# Patient Record
Sex: Male | Born: 1985 | Race: Black or African American | Hispanic: No | Marital: Single | Smoking: Never smoker
Health system: Southern US, Community
[De-identification: ages and names within clinical notes are randomized; demographics above are authoritative.]

## PROBLEM LIST (undated history)

## (undated) DIAGNOSIS — G7000 Myasthenia gravis without (acute) exacerbation: Secondary | ICD-10-CM

## (undated) HISTORY — PX: NO PAST SURGERIES: SHX2092

## (undated) HISTORY — PX: OTHER SURGICAL HISTORY: SHX169

## (undated) HISTORY — DX: Myasthenia gravis without (acute) exacerbation: G70.00

---

## 2009-05-19 ENCOUNTER — Emergency Department (HOSPITAL_COMMUNITY): Admission: EM | Admit: 2009-05-19 | Discharge: 2009-05-20 | Payer: Self-pay | Admitting: Emergency Medicine

## 2009-05-23 ENCOUNTER — Emergency Department (HOSPITAL_COMMUNITY): Admission: EM | Admit: 2009-05-23 | Discharge: 2009-05-23 | Payer: Self-pay | Admitting: Emergency Medicine

## 2011-12-28 ENCOUNTER — Ambulatory Visit (INDEPENDENT_AMBULATORY_CARE_PROVIDER_SITE_OTHER): Payer: BC Managed Care – PPO | Admitting: Family Medicine

## 2011-12-28 DIAGNOSIS — Z20828 Contact with and (suspected) exposure to other viral communicable diseases: Secondary | ICD-10-CM

## 2011-12-28 DIAGNOSIS — Z202 Contact with and (suspected) exposure to infections with a predominantly sexual mode of transmission: Secondary | ICD-10-CM

## 2011-12-28 NOTE — Progress Notes (Signed)
  Subjective:    Patient ID: Eddie [name], male    DOB: 08-08-1986, 26 y.o.   MRN: [id number]  HPI patient's recent girlfriend reported to them that she was positive for herpes. He is not sure whether it is type I or type II. He had chlamydia many years ago. Usually uses protection but did not last time. He has had other sexual partners in the past.   Review of Systems No lesions on his penis at this time.    Objective:   Physical Exam  No physical exam was done at this time     Assessment & Plan:  STD exposure to herpes. Will need to do a broad range of testing. Patient understands.

## 2011-12-28 NOTE — Patient Instructions (Addendum)
As per our discussion the risks of sexual activity were discussed. Patient was advised to call me in the event that he does not hear a result by next Monday.  Sexually Transmitted Disease Sexually transmitted disease (STD) refers to any infection that is passed from person to person during sexual activity. This may happen by way of saliva, semen, blood, vaginal mucus, or urine. Common STDs include:  Gonorrhea.   Chlamydia.   Syphilis.   HIV/AIDS.   Genital herpes.   Hepatitis B and C.   Trichomonas.   Human papillomavirus (HPV).   Pubic lice.  CAUSES  An STD may be spread by bacteria, virus, or parasite. A person can get an STD by:  Sexual intercourse with an infected person.   Sharing sex toys with an infected person.   Sharing needles with an infected person.   Having intimate contact with the genitals, mouth, or rectal areas of an infected person.  SYMPTOMS  Some people may not have any symptoms, but they can still pass the infection to others. Different STDs have different symptoms. Symptoms include:  Painful or bloody urination.   Pain in the pelvis, abdomen, vagina, anus, throat, or eyes.   Skin rash, itching, irritation, growths, or sores (lesions). These usually occur in the genital or anal area.   Abnormal vaginal discharge.   Penile discharge in men.   Soft, flesh-colored skin growths in the genital or anal area.   Fever.   Pain or bleeding during sexual intercourse.   Swollen glands in the groin area.   Yellow skin and eyes (jaundice). This is seen with hepatitis.  DIAGNOSIS  To make a diagnosis, your caregiver may:  Take a medical history.   Perform a physical exam.   Take a specimen (culture) to be examined.   Examine a sample of discharge under a microscope.   Perform blood tests.   Perform a Pap test, if this applies.   Perform a colposcopy.   Perform a laparoscopy.  TREATMENT   Chlamydia, gonorrhea, trichomonas, and syphilis  can be cured with antibiotic medicine.   Genital herpes, hepatitis, and HIV can be treated, but not cured, with prescribed medicines. The medicines will lessen the symptoms.   Genital warts from HPV can be treated with medicine or by freezing, burning (electrocautery), or surgery. Warts may come back.   HPV is a virus and cannot be cured with medicine or surgery.However, abnormal areas may be followed very closely by your caregiver and may be removed from the cervix, vagina, or vulva through office procedures or surgery.  If your diagnosis is confirmed, your recent sexual partners need treatment. This is true even if they are symptom-free or have a negative culture or evaluation. They should not have sex until their caregiver says it is okay. HOME CARE INSTRUCTIONS  All sexual partners should be informed, tested, and treated for all STDs.   Take your antibiotics as directed. Finish them even if you start to feel better.   Only take over-the-counter or prescription medicines for pain, discomfort, or fever as directed by your caregiver.   Rest.   Eat a balanced diet and drink enough fluids to keep your urine clear or pale yellow.   Do not have sex until treatment is completed and you have followed up with your caregiver. STDs should be checked after treatment.   Keep all follow-up appointments, Pap tests, and blood tests as directed by your caregiver.   Only use latex condoms and water-soluble lubricants during  sexual activity. Do not use petroleum jelly or oils.   Avoid alcohol and illegal drugs.   Get vaccinated for HPV and hepatitis. If you have not received these vaccines in the past, talk to your caregiver about whether one or both might be right for you.   Avoid risky sex practices that can break the skin.  The only way to avoid getting an STD is to avoid all sexual activity.Latex condoms and dental dams (for oral sex) will help lessen the risk of getting an STD, but will not  completely eliminate the risk. SEEK MEDICAL CARE IF:   You have a fever.   You have any new or worsening symptoms.  Document Released: 02/05/2003 Document Revised: 07/28/2011 Document Reviewed: 02/12/2011 [facility] Patient Information 2012 [place], [place].

## 2011-12-29 LAB — GC/CHLAMYDIA PROBE AMP, URINE: GC Probe Amp, Urine: NEGATIVE

## 2011-12-30 LAB — HSV(HERPES SIMPLEX VRS) I + II AB-IGG: HSV 1 Glycoprotein G Ab, IgG: <0.1 IV

## 2011-12-31 ENCOUNTER — Telehealth: Payer: Self-pay

## 2011-12-31 NOTE — Telephone Encounter (Signed)
.  [facility] Pt is calling for his lab results  Please call asap

## 2011-12-31 NOTE — Telephone Encounter (Signed)
Spoke to patient. 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 I explained to the patient that we cannot tell me in he has contracted this, and if he breaks out with any genital lesions he needs to get checked.

## 2012-01-17 ENCOUNTER — Telehealth: Payer: Self-pay | Admitting: Family Medicine

## 2012-01-17 NOTE — Telephone Encounter (Signed)
Dr Alwyn Ren,  Pt would like for you to give him a call back.  Pt states that it is Urgent.  Pt would not state the reason for you to give him a call back.

## 2012-01-18 ENCOUNTER — Other Ambulatory Visit: Payer: Self-pay | Admitting: Family Medicine

## 2012-01-18 MED ORDER — VALACYCLOVIR HCL 1 G PO TABS: 1000.0000 mg | ORAL_TABLET | Freq: Two times a day (BID) | ORAL | Status: DC

## 2012-01-18 NOTE — Telephone Encounter (Signed)
Pt is requesting any doctor to write a script for hsv medication  [facility] on [address]  [phone]

## 2012-03-06 ENCOUNTER — Other Ambulatory Visit: Payer: Self-pay | Admitting: Family Medicine

## 2012-03-06 NOTE — Telephone Encounter (Signed)
PT HAD REQUESTED A REFILL ON SOME MEDICINE AND WAS TOLD TO CALL HIS PHARMACY, HE THEN WANTED DR HOPPER TO GIVE HIM A CALL AND DISCUSS SOMETHING WITH HIM.  HE DIDN'T SEEM HAPPY TO HAVE BEEN TOLD TO CALL HIS PHARMACY FOR A REFILL PLEASE CALL [phone]

## 2012-10-03 ENCOUNTER — Other Ambulatory Visit: Payer: Self-pay | Admitting: Family Medicine

## 2012-10-03 ENCOUNTER — Other Ambulatory Visit: Payer: Self-pay | Admitting: Physician Assistant

## 2012-10-03 NOTE — Telephone Encounter (Signed)
Pt is leaving town today and needs his medication (see in basket for refills)   Also, requesting automatic refills every thirty days.  Call [phone]

## 2013-12-27 ENCOUNTER — Ambulatory Visit (INDEPENDENT_AMBULATORY_CARE_PROVIDER_SITE_OTHER): Payer: BC Managed Care – PPO | Admitting: Family Medicine

## 2013-12-27 VITALS — BP 116/70 | HR 64 | Temp 98.5°F | Resp 18 | Ht 67.5 in | Wt 136.0 lb

## 2013-12-27 DIAGNOSIS — N453 Epididymo-orchitis: Secondary | ICD-10-CM

## 2013-12-27 DIAGNOSIS — N451 Epididymitis: Secondary | ICD-10-CM

## 2013-12-27 LAB — POCT CBC
Granulocyte percent: 57.9 %G (ref 37–80)
HCT, POC: 46.9 % (ref 43.5–53.7)
Hemoglobin: 14.6 g/dL (ref 14.1–18.1)
Lymph, poc: 1.4 (ref 0.6–3.4)
MCH, POC: 29.5 pg (ref 27–31.2)
MCHC: 31.1 g/dL — AB (ref 31.8–35.4)
MCV: 94.7 fL (ref 80–97)
MID (cbc): 0.3 (ref 0–0.9)
MPV: 9.3 fL (ref 0–99.8)
POC Granulocyte: 2.3 (ref 2–6.9)
POC LYMPH PERCENT: 35.4 %L (ref 10–50)
POC MID %: 6.7 %M (ref 0–12)
Platelet Count, POC: 200 10*3/uL (ref 142–424)
RBC: 4.95 M/uL (ref 4.69–6.13)
RDW, POC: 14.3 %
WBC: 4 10*3/uL — AB (ref 4.6–10.2)

## 2013-12-27 LAB — POCT SEDIMENTATION RATE: POCT SED RATE: 9 mm/hr (ref 0–22)

## 2013-12-27 MED ORDER — DOXYCYCLINE HYCLATE 100 MG PO TABS: 100.0000 mg | ORAL_TABLET | Freq: Two times a day (BID) | ORAL | Status: DC

## 2013-12-27 MED ORDER — MELOXICAM 7.5 MG PO TABS: 7.5000 mg | ORAL_TABLET | Freq: Every day | ORAL | Status: DC

## 2013-12-27 NOTE — Progress Notes (Signed)
Patient is a 28 year old gentleman who has left testicle sensitivity. Has been going on for couple weeks. He has no other signs of STDs but would like to be tested for them.  Patient had no history of left testicle pain he notes his left testicle is a little bit lower than the right one. Patient says that the aching left testicle radiates a bit up into his left inguinal region.  Patient has no fever or dysuria  Objective: No acute distress Genitalia normal circumcised male. Left testicle slightly lower than the right as expected. There is no nodularity to the left testicle and epididymis is nontender. Hernia exam negative.  Epididymitis - Plan: POCT CBC, POCT SEDIMENTATION RATE, RPR, HSV(herpes simplex vrs) 1+2 ab-IgG, HIV antibody, GC/Chlamydia Probe Amp, doxycycline (VIBRA-TABS) 100 MG tablet, meloxicam (MOBIC) 7.5 MG tablet  Signed, Robyn Haber, MD

## 2013-12-27 NOTE — Patient Instructions (Signed)
Epididymitis  Epididymitis is a swelling (inflammation) of the epididymis. The epididymis is a cord-like structure along the back part of the testicle. Epididymitis is usually, but not always, caused by infection. This is usually a sudden problem beginning with chills, fever and pain behind the scrotum and in the testicle. There may be swelling and redness of the testicle.  DIAGNOSIS   Physical examination will reveal a tender, swollen epididymis. Sometimes, cultures are obtained from the urine or from prostate secretions to help find out if there is an infection or if the cause is a different problem. Sometimes, blood work is performed to see if your white blood cell count is elevated and if a germ (bacterial) or viral infection is present. Using this knowledge, an appropriate medicine which kills germs (antibiotic) can be chosen by your caregiver. A viral infection causing epididymitis will most often go away (resolve) without treatment.  HOME CARE INSTRUCTIONS   · Hot sitz baths for 20 minutes, 4 times per day, may help relieve pain.  · Only take over-the-counter or prescription medicines for pain, discomfort or fever as directed by your caregiver.  · Take all medicines, including antibiotics, as directed. Take the antibiotics for the full prescribed length of time even if you are feeling better.  · It is very important to keep all follow-up appointments.  SEEK IMMEDIATE MEDICAL CARE IF:   · You have a fever.  · You have pain not relieved with medicines.  · You have any worsening of your problems.  · Your pain seems to come and go.  · You develop pain, redness, and swelling in the scrotum and surrounding areas.  MAKE SURE YOU:   · Understand these instructions.  · Will watch your condition.  · Will get help right away if you are not doing well or get worse.  Document Released: 11/12/2000 Document Revised: 02/07/2012 Document Reviewed: 10/02/2009  [employer]® Patient Information ©2014 [employer].

## 2013-12-28 LAB — HSV(HERPES SIMPLEX VRS) I + II AB-IGG
HSV 1 Glycoprotein G Ab, IgG: 0.11 IV
HSV 2 Glycoprotein G Ab, IgG: 2.97 IV — ABNORMAL HIGH

## 2013-12-28 LAB — RPR

## 2013-12-28 LAB — HIV ANTIBODY (ROUTINE TESTING W REFLEX): HIV: NONREACTIVE

## 2013-12-28 LAB — GC/CHLAMYDIA PROBE AMP
CT Probe RNA: NEGATIVE
GC Probe RNA: NEGATIVE

## 2014-01-09 ENCOUNTER — Telehealth: Payer: Self-pay

## 2014-01-09 ENCOUNTER — Other Ambulatory Visit: Payer: Self-pay | Admitting: Physician Assistant

## 2014-01-09 DIAGNOSIS — R768 Other specified abnormal immunological findings in serum: Secondary | ICD-10-CM

## 2014-01-09 NOTE — Telephone Encounter (Signed)
Patient called requesting a refill on his Valtrex prescription ASAP. Please call patient.     Thank You!!!

## 2014-01-10 DIAGNOSIS — R768 Other specified abnormal immunological findings in serum: Secondary | ICD-10-CM | POA: Insufficient documentation

## 2014-01-10 DIAGNOSIS — R7689 Other specified abnormal immunological findings in serum: Secondary | ICD-10-CM | POA: Insufficient documentation

## 2014-01-10 MED ORDER — VALACYCLOVIR HCL 1 G PO TABS: ORAL_TABLET | ORAL | Status: DC

## 2014-01-10 NOTE — Telephone Encounter (Signed)
Please notify patient.  Meds ordered this encounter  Medications  . valACYclovir (VALTREX) 1000 MG tablet    Sig: 1/2 po qd for suppression - for an outbreak increase to 1/2 po bid for 3 days    Dispense:  30 tablet    Refill:  5    Order Specific Question:  Supervising Provider    Answer:  DOOLITTLE, ROBERT P [[id number]]

## 2014-01-10 NOTE — Telephone Encounter (Signed)
Patient notified and voiced understanding.

## 2014-11-06 ENCOUNTER — Ambulatory Visit: Payer: BC Managed Care – PPO | Admitting: Family Medicine

## 2014-11-06 DIAGNOSIS — Z111 Encounter for screening for respiratory tuberculosis: Secondary | ICD-10-CM

## 2016-10-01 ENCOUNTER — Other Ambulatory Visit: Payer: Self-pay | Admitting: Physician Assistant

## 2016-10-01 ENCOUNTER — Telehealth: Payer: Self-pay

## 2016-10-01 DIAGNOSIS — R768 Other specified abnormal immunological findings in serum: Secondary | ICD-10-CM

## 2016-10-01 NOTE — Telephone Encounter (Signed)
Patient is requesting a refill prescription for cyclovir.  Patient is okay with being seen today if needed.  Please contact with further instruction.  [phone]

## 2016-10-04 NOTE — Telephone Encounter (Signed)
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  Last OV was in 2015.

## 2017-03-17 LAB — TSH: TSH: 0.77 u[IU]/mL (ref <=5.90)

## 2017-03-23 ENCOUNTER — Other Ambulatory Visit (HOSPITAL_COMMUNITY): Payer: Self-pay | Admitting: Ophthalmology

## 2017-03-23 ENCOUNTER — Other Ambulatory Visit: Payer: Self-pay | Admitting: Ophthalmology

## 2017-03-23 DIAGNOSIS — E0500 Thyrotoxicosis with diffuse goiter without thyrotoxic crisis or storm: Secondary | ICD-10-CM

## 2017-03-23 DIAGNOSIS — H532 Diplopia: Secondary | ICD-10-CM

## 2017-03-29 ENCOUNTER — Other Ambulatory Visit: Payer: Self-pay | Admitting: Ophthalmology

## 2017-03-29 DIAGNOSIS — E079 Disorder of thyroid, unspecified: Secondary | ICD-10-CM

## 2017-03-29 DIAGNOSIS — E0500 Thyrotoxicosis with diffuse goiter without thyrotoxic crisis or storm: Secondary | ICD-10-CM

## 2017-03-30 ENCOUNTER — Encounter: Payer: Self-pay | Admitting: Family Medicine

## 2017-03-30 ENCOUNTER — Encounter (HOSPITAL_COMMUNITY): Payer: Self-pay

## 2017-03-30 ENCOUNTER — Ambulatory Visit (INDEPENDENT_AMBULATORY_CARE_PROVIDER_SITE_OTHER): Payer: BC Managed Care – PPO | Admitting: Family Medicine

## 2017-03-30 ENCOUNTER — Ambulatory Visit (HOSPITAL_COMMUNITY): Payer: BC Managed Care – PPO

## 2017-03-30 VITALS — BP 122/76 | HR 72 | Temp 98.4°F | Ht 67.5 in | Wt 151.2 lb

## 2017-03-30 DIAGNOSIS — G7000 Myasthenia gravis without (acute) exacerbation: Secondary | ICD-10-CM

## 2017-03-30 DIAGNOSIS — E0500 Thyrotoxicosis with diffuse goiter without thyrotoxic crisis or storm: Secondary | ICD-10-CM | POA: Diagnosis not present

## 2017-03-30 DIAGNOSIS — Z23 Encounter for immunization: Secondary | ICD-10-CM | POA: Diagnosis not present

## 2017-03-30 DIAGNOSIS — J302 Other seasonal allergic rhinitis: Secondary | ICD-10-CM

## 2017-03-30 DIAGNOSIS — H532 Diplopia: Secondary | ICD-10-CM | POA: Diagnosis not present

## 2017-03-30 DIAGNOSIS — Z7689 Persons encountering health services in other specified circumstances: Secondary | ICD-10-CM

## 2017-03-30 LAB — COMPREHENSIVE METABOLIC PANEL
ALK PHOS: 48 U/L (ref 39–117)
ALT: 24 U/L (ref 0–53)
AST: 24 U/L (ref 0–37)
Albumin: 4.4 g/dL (ref 3.5–5.2)
BUN: 20 mg/dL (ref 6–23)
CO2: 27 meq/L (ref 19–32)
Calcium: 10 mg/dL (ref 8.4–10.5)
Chloride: 105 mEq/L (ref 96–112)
Creatinine, Ser: 1.08 mg/dL (ref 0.40–1.50)
GFR: 101.51 mL/min (ref >60.00)
GLUCOSE: 122 mg/dL — AB (ref 70–99)
POTASSIUM: 4 meq/L (ref 3.5–5.1)
Sodium: 139 mEq/L (ref 135–145)
Total Bilirubin: 0.5 mg/dL (ref 0.2–1.2)
Total Protein: 7.5 g/dL (ref 6.0–8.3)

## 2017-03-30 NOTE — Patient Instructions (Signed)
Please sign up for Mychart  You will receive a call about an appointment with the endocrine specialist  For allergies- over the counter daily Zyrtec, Allegra or Claritin (generic is fine), can also use saline nasal spray several times a day If no improvement, please let me know and we can talk about other options.

## 2017-03-30 NOTE — Progress Notes (Signed)
Subjective:    Patient ID: Eddie [name], male    DOB: 1986-03-09, 31 y.o.   MRN: [id number]  HPI  This is a 31 yo male who presents today to establish care. 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  Was on a ski trip 2/18, had some visual changes (blurred vision and double vision) which seem to have gotten worse. Went to see opthalmology (Dr. 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 He was told to have thyroid tests and Myasthenia Gravis panel checked. He denies palpitation, anxiety, weight loss or difficulty sleeping.   Father and brother have hyperthyroid and father had to have surgery on eyes.   Last CPE- 2 years ago Tdap- will have today Flu- never Dental- over due Eye- had this month Exercise-regular   Past Medical History:  Diagnosis Date  . Thyroid disease    No past surgical history on file. Family History  Problem Relation Age of Onset  . Cancer Mother    Social History  Substance Use Topics  . Smoking status: Never Smoker  . Smokeless tobacco: Never Used  . Alcohol use 1.2 oz/week    2 Shots of liquor per week        Review of Systems  Constitutional: Positive for fatigue (realted to teaching 5th graders). Negative for unexpected weight change.  HENT: Positive for rhinorrhea and sneezing.   Eyes: Positive for visual disturbance.  Respiratory: Negative for cough and shortness of breath.   Cardiovascular: Negative for chest pain and palpitations.  Gastrointestinal: Negative.   Endocrine: Negative.   Genitourinary: Negative.   Musculoskeletal: Negative.   Skin: Negative.   Allergic/Immunologic: Positive for environmental allergies (spring, uses benadryl).  Neurological: Positive for headaches (with recent vision disturbance).  Hematological: Negative.   Psychiatric/Behavioral: Negative for dysphoric mood and sleep disturbance. The patient is not nervous/anxious.        Objective:   Physical Exam  Constitutional: He is  oriented to person, place, and time. He appears well-developed and well-nourished. No distress.  HENT:  Head: Normocephalic and atraumatic.  Right Ear: Tympanic membrane, external ear and ear canal normal.  Left Ear: Tympanic membrane, external ear and ear canal normal.  Nose: Rhinorrhea present.  Mouth/Throat: Oropharynx is clear and moist. No oropharyngeal exudate.  Eyes: Conjunctivae are normal.  Neck: Normal range of motion. Neck supple. Thyromegaly (left sided fullness) present.  Cardiovascular: Normal rate, regular rhythm and normal heart sounds.   Pulmonary/Chest: Effort normal and breath sounds normal. No respiratory distress. He has no wheezes. He has no rales.  Musculoskeletal: Normal range of motion. He exhibits no edema.  Lymphadenopathy:    He has no cervical adenopathy.  Neurological: He is alert and oriented to person, place, and time.  Skin: Skin is warm and dry. He is not diaphoretic.  Psychiatric: He has a normal mood and affect. His behavior is normal. Judgment and thought content normal.  Vitals reviewed.     BP 122/76 (BP Location: Right Arm, Patient Position: Sitting, Cuff Size: Normal)   Pulse 72   Temp 98.4 F (36.9 C) (Oral)   Ht 5' 7.5" (1.715 m)   Wt 151 lb 3.2 oz (68.6 kg)   SpO2 97%   BMI 23.33 kg/m  Wt Readings from Last 3 Encounters:  03/30/17 151 lb 3.2 oz (68.6 kg)  12/27/13 136 lb (61.7 kg)  12/28/11 136 lb (61.7 kg)      Assessment & Plan:  1. Encounter to establish care - discussed current needs,  encouraged him to sign up for MyChart  2. 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 Dwyane Dee to get patient in with in 2 weeks - Acetylcholine receptor, binding - Striated muscle antibody - Comprehensive metabolic panel  3. Diplopia - Thyrotropin receptor autoabs - Thyroid stimulating immunoglobulin -  Acetylcholine receptor, binding - Striated muscle antibody  4. Need for Tdap vaccination - Tdap vaccine greater than or equal to 7yo IM  5. Seasonal allergic rhinitis, unspecified trigger - OTC long acting antihistamine and saline spray prn, he was instructed to let me know if no improvement and will add inhaled nasal steroid if needed  - follow up as needed  Clarene Reamer, FNP-BC  [facility] [place], [place]  03/30/2017 3:09 PM

## 2017-03-30 NOTE — Progress Notes (Signed)
Pre visit review using our clinic review tool, if applicable. No additional management support is needed unless otherwise documented below in the visit note. 

## 2017-03-31 ENCOUNTER — Encounter: Payer: Self-pay | Admitting: Endocrinology

## 2017-03-31 ENCOUNTER — Ambulatory Visit (INDEPENDENT_AMBULATORY_CARE_PROVIDER_SITE_OTHER): Payer: BC Managed Care – PPO | Admitting: Endocrinology

## 2017-03-31 DIAGNOSIS — H0520 Unspecified exophthalmos: Secondary | ICD-10-CM

## 2017-03-31 DIAGNOSIS — E0500 Thyrotoxicosis with diffuse goiter without thyrotoxic crisis or storm: Secondary | ICD-10-CM

## 2017-03-31 NOTE — Patient Instructions (Addendum)
Although your thyroid is functioning normally now, it will probably become abnormal in the future.   Therefore, you should have your thyroid examined, and a thyroid blood test checked, at least each year.   Any treatment for your eyes would come from your eye doctor.  However, treatments for the eyes have side-effects, so it is reasonable that your eye doctor hesitates.    I would be happy to see you back here as needed.

## 2017-03-31 NOTE — Progress Notes (Addendum)
Subjective:    Patient ID: Eddie [name], male    DOB: May 26, 1986, 31 y.o.   MRN: [id number]  HPI Pt is referred by Glenda Chroman, NP, for thyroid eye disease.  Pt reports few mos of moderate irritation of both eyes, and assoc intermittent diplopia.  He has never been on thyroid medication.  He has never had XRT to the anterior neck, or thyroid surgery.  He has never had thyroid imaging.  He does not consume kelp or any other prescribed or non-prescribed thyroid medication.  He has never been on amiodarone.   Past Medical History:  Diagnosis Date  . Thyroid disease     No past surgical history on file.  Social History   Social History  . Marital status: Single    Spouse name: N/A  . Number of children: N/A  . Years of education: N/A   Occupational History  . Not on file.   Social History Main Topics  . Smoking status: Never Smoker  . Smokeless tobacco: Never Used  . Alcohol use 1.2 oz/week    2 Shots of liquor per week  . Drug use: No  . Sexual activity: Yes    Birth control/ protection: Condom   Other Topics Concern  . Not on file   Social History Narrative  . No narrative on file    Current Outpatient Prescriptions on File Prior to Visit  Medication Sig Dispense Refill  . valACYclovir (VALTREX) 1000 MG tablet 1/2 po qd for suppression - for an outbreak increase to 1/2 po bid for 3 days 30 tablet 5   No current facility-administered medications on file prior to visit.     No Known Allergies  Family History  Problem Relation Age of Onset  . Cancer Mother   . Thyroid disease Father   . Thyroid disease Brother     BP 108/68   Pulse 76   Ht 5\' 8"  (1.727 m)   Wt 151 lb (68.5 kg)   SpO2 97%   BMI 22.96 kg/m    Review of Systems denies weight loss, headache, hoarseness, photosensitivity, palpitations, sob, diarrhea, polyuria, muscle weakness, excessive diaphoresis, tremor, anxiety, heat intolerance, easy bruising, and rhinorrhea.     Objective:   Physical  Exam VS: see vs page GEN: no distress HEAD: head: no deformity eyes: no periorbital swelling; moderate bilat proptosis external nose and ears are normal mouth: no lesion seen NECK: supple, thyroid is not enlarged CHEST WALL: no deformity LUNGS: clear to auscultation CV: reg rate and rhythm, no murmur ABD: abdomen is soft, nontender.  no hepatosplenomegaly.  not distended.  no hernia MUSCULOSKELETAL: muscle bulk and strength are grossly normal.  no obvious joint swelling.  gait is normal and steady EXTEMITIES: no deformity.  no ulcer on the feet.  feet are of normal color and temp.  no edema PULSES: dorsalis pedis intact bilat.  no carotid bruit NEURO:  cn 2-12 grossly intact.   readily moves all 4's.  sensation is intact to touch on the feet.  No tremor.  SKIN:  Normal texture and temperature.  No rash or suspicious lesion is visible.  Not diaphoretic.  No dermopathy on the legs.  NODES:  None palpable at the neck PSYCH: alert, well-oriented.  Does not appear anxious nor depressed.   Lab Results  Component Value Date   TSH 0.77 03/17/2017   I have reviewed outside records, and summarized: Pt was noted to have thyroid eye disease, and referred here. Total T4  was elevated but FTI was normal.  CT of orbits was ordered, but has not yet been done.      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  Proptosis, new to me.    Patient Instructions  Although your thyroid is functioning normally now, it will probably become abnormal in the future.   Therefore, you should have your thyroid examined, and a thyroid blood test checked, at least each year.   Any treatment for your eyes would come from your eye doctor.  However, treatments for the eyes have side-effects, so it is reasonable that your eye doctor hesitates.    I would be happy to see you back here as needed.

## 2017-04-02 DIAGNOSIS — H0520 Unspecified exophthalmos: Secondary | ICD-10-CM | POA: Insufficient documentation

## 2017-04-03 LAB — THYROID STIMULATING IMMUNOGLOBULIN: TSI: <89 %{baseline}

## 2017-04-04 LAB — STRIATED MUSCLE ANTIBODY: Striated Muscle Ab: <1:40 {titer}

## 2017-04-04 LAB — THYROTROPIN RECEPTOR AUTOABS: Thyrotropin Receptor Ab: <6 % (ref <=16.0)

## 2017-04-05 LAB — ACETYLCHOLINE RECEPTOR, BINDING: A CHR BINDING ABS: 0.92 nmol/L — ABNORMAL HIGH

## 2017-04-05 NOTE — Addendum Note (Signed)
Addended by: Clarene Reamer B on: 04/05/2017 05:33 PM   Modules accepted: Orders

## 2017-04-06 ENCOUNTER — Encounter: Payer: Self-pay | Admitting: Family Medicine

## 2017-04-07 ENCOUNTER — Ambulatory Visit (HOSPITAL_COMMUNITY): Payer: BC Managed Care – PPO

## 2017-04-26 ENCOUNTER — Ambulatory Visit (INDEPENDENT_AMBULATORY_CARE_PROVIDER_SITE_OTHER): Payer: BC Managed Care – PPO | Admitting: Neurology

## 2017-04-26 ENCOUNTER — Encounter: Payer: Self-pay | Admitting: Neurology

## 2017-04-26 ENCOUNTER — Telehealth: Payer: Self-pay | Admitting: Neurology

## 2017-04-26 VITALS — BP 123/86 | HR 51 | Ht 67.0 in | Wt 154.0 lb

## 2017-04-26 DIAGNOSIS — M6289 Other specified disorders of muscle: Secondary | ICD-10-CM

## 2017-04-26 DIAGNOSIS — H532 Diplopia: Secondary | ICD-10-CM

## 2017-04-26 DIAGNOSIS — D4989 Neoplasm of unspecified behavior of other specified sites: Secondary | ICD-10-CM

## 2017-04-26 DIAGNOSIS — D150 Benign neoplasm of thymus: Secondary | ICD-10-CM

## 2017-04-26 DIAGNOSIS — G7000 Myasthenia gravis without (acute) exacerbation: Secondary | ICD-10-CM

## 2017-04-26 DIAGNOSIS — H02403 Unspecified ptosis of bilateral eyelids: Secondary | ICD-10-CM | POA: Diagnosis not present

## 2017-04-26 DIAGNOSIS — H499 Unspecified paralytic strabismus: Secondary | ICD-10-CM | POA: Diagnosis not present

## 2017-04-26 MED ORDER — PYRIDOSTIGMINE BROMIDE 60 MG PO TABS: ORAL_TABLET | ORAL | 6 refills | Status: DC

## 2017-04-26 MED ORDER — PREDNISONE 20 MG PO TABS: 20.0000 mg | ORAL_TABLET | Freq: Every day | ORAL | 6 refills | Status: DC

## 2017-04-26 NOTE — Telephone Encounter (Signed)
Per Dr. Jaynee Eagles, see if we can get pt in sooner for a NCS/EMG. Currently scheduled for 7/5

## 2017-04-26 NOTE — Progress Notes (Signed)
[facility]    Provider:  Dr Jaynee Eagles Referring Provider: Elby Beck, FNP Primary Care Physician:  Elby Beck, FNP  CC:  Myasthenia gravis  HPI:  Eddie [name] is a 31 y.o. male here as a referral from Dr. Carlean Purl for myasthenia gravis. Patient reported diplopia and vision changes starting in February. 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 He was instructed to have thyroid tests as well as myasthenia gravis panel checked. 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 Ach: Receptor binding antibodies were positive at 0.92. Back in February he went on a ski trip and towards the end his eyes started feeling weak with trouble focusing. Father had a thyroid issue so he had testing and tsh was normal. He woke up one night nd looked at the clock on the cable box and saw 2 different times. Dr. Manuella Ghazi diagnosed him with thyrotoxicosis and then he was told he had myasthenia gravis. His right eye wanders now. He is having difficulty lifting his arms and working out, he can;t weight lift as well as he used to. No issues swallowing but he has loss of strength. The double vision is worse in the morning and bad all day. Not better after a nap is worse. His eyes are misaligned. He has ptosis. No swallowing difficulty. No voice qualityor breathing difficulties. Feels like it is progressing. Strong family history of autoimmune disease.  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 Symptoms have gotten worse since then. He went to see an ophthalmologist who diagnosed him with thyrotoxicosis and diffuse goiter, ocular muscle weakness. He was to hold have thyroid tests and  myasthenia gravis panel checked. Labs showed thyrotropin receptor autoantibodies, thyroid stimulating immunoglobulin and he was referred to endocrinology. Also reviewed endocrinology diagnosed with Graves' disease.  Ach Receptor binding antibodies were positive.   Review of Systems: Patient complains of symptoms per HPI as well as the following symptoms: No chest pain, no rashes, no shortness of breath. Pertinent negatives and positives per HPI. All others negative.   Social History   Social History  . Marital status: Single    Spouse name: N/A  . Number of children: 2  . Years of education: 16   Occupational History  . [place] History Main Topics  . Smoking status: Never Smoker  . Smokeless tobacco: Never Used  . Alcohol use 1.2 oz/week    2 Shots of liquor per week  . Drug use: No  . Sexual activity: Yes    Birth control/ protection: Condom   Other Topics Concern  . Not on file   Social History Narrative   Lives at home alone   Caffeine: 1 large coffee daily     Family History  Problem Relation Age of Onset  . Cancer Mother   . Thyroid disease Father   . Thyroid disease Brother     Past Medical History:  Diagnosis Date  . Myasthenia gravis [facility])     Past Surgical History:  Procedure Laterality Date  . NO PAST SURGERIES      Current Outpatient Prescriptions  Medication Sig Dispense Refill  . Nutritional Supplements (PROTEIN SUPPLEMENT 80% PO) Take by mouth.    Marland Kitchen  timolol (BETIMOL) 0.25 % ophthalmic solution 1-2 drops 2 (two) times daily.    . predniSONE (DELTASONE) 20 MG tablet Take 1 tablet (20 mg total) by mouth daily. 30 tablet 6  . pyridostigmine (MESTINON) 60 MG tablet Start with 1/2 pill three times daily and then in 1-2 weeks can increase to a whole pill three times a day 90 tablet 6  . valACYclovir (VALTREX) 1000 MG tablet 1/2 po qd for suppression - for an outbreak increase to 1/2 po bid for 3 days 30 tablet 5   No  current facility-administered medications for this visit.     Allergies as of 04/26/2017  . (No Known Allergies)    Vitals: BP 123/86   Pulse (!) 51   Ht 5\' 7"  (1.702 m)   Wt 154 lb (69.9 kg)   BMI 24.12 kg/m  Last Weight:  Wt Readings from Last 1 Encounters:  04/26/17 154 lb (69.9 kg)   Last Height:   Ht Readings from Last 1 Encounters:  04/26/17 5\' 7"  (1.702 m)   Physical exam: Exam: Gen: NAD, conversant, well nourised, obese, well groomed                     CV: RRR, no MRG. No Carotid Bruits. No peripheral edema, warm, nontender Eyes: Conjunctivae clear without exudates or hemorrhage  Neuro: Detailed Neurologic Exam  Speech:    Speech is normal; fluent and spontaneous with normal comprehension.  Cognition:    The patient is oriented to person, place, and time;     recent and remote memory intact;     language fluent;     normal attention, concentration,     fund of knowledge Cranial Nerves:    The pupils are equal, round, and reactive to light. The fundi are normal and spontaneous venous pulsations are present. Visual fields are full to finger confrontation. Disconjugate gaze, Cannot abduct or addduct laterally or medially, can cross the midline on the right but not on left, fatiguable upgaze, Ptosis left > right, Trigeminal sensation is intact and the muscles of mastication are normal. The face is symmetric. The palate elevates in the midline. Hearing intact. Voice is normal. Shoulder shrug is normal. The tongue has normal motion without fasciculations.   Coordination:    Normal finger to nose and heel to shin. Normal rapid alternating movements.   Gait:    Heel-toe and tandem gait are normal.   Motor Observation:    No asymmetry, no atrophy, and no involuntary movements noted. Tone:    Normal muscle tone.    Posture:    Posture is normal. normal erect    Strength:    Strength is V/V in the upper and lower limbs.      Sensation: intact to LT       Reflex Exam:  DTR's:    Deep tendon reflexes in the upper and lower extremities are normal bilaterally.   Toes:    The toes are downgoing bilaterally.   Clonus:    Clonus is absent.    Assessment/Plan:  31 year old with acetylcholine receptor binding antibody positive here for evaluation of possible myasthenia gravis. 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 Strength is 5 out of 5 however given patient's reports of weakness in the gym I do suspect there may be generalization. Had a long discussion about myasthenia gravis, provided patient with documentation to read as well as reliable online sites for information.  CT of the chest w contrast  to evaluate for enlarged thymus MRI of the brain to evaluate for other etiologies such as MS or other brainstem lesions causing symptoms of diplopia and opthalmoplegia EMG/NCS for repetitive nerve stimulation only Start Mestinon 3 times a day  Prednisone 20 mg taken the morning with food daily watch for GI upset. In 1-2 weeks as long as no side effects or worsening we may increase please email me.  Take Mestinon (Pyridostigmine) half tablet 3 times daily. If no side effects may increase to a whole tablet 3 times daily. Please pay attention if this medication kicks in her wears off and how long in between.  After workup there are other medications we may consider in place of the steroids as he do not like long-term steroids due to side effects. Other medications we might use include immunosuppressants such as  [name] or Azathioprine.   - Labs at next appointment include CMP, CBC, GGT,and Thiopurine methyltransferase   We had a discussion about different medication strategies, possible adverse effects and strategies to manage them, expected time course of benefits in medication management which include hours to days for Mestinon, weeks to many months for prednisone, and many months to a year more for Azathioprine. In  mild to moderate generalized myasthenia gravis, Mestinon may be the only treatment. A cholinergic crisis, in which weakness is worsened by increased doses of the Mestinon, rarely occurs. The concomitant use of prednisone and sometimes another immunosuppressant is often required. In more severe myasthenia gravis, immunosuppression and sometimes immunomodulation should be started at the same time as Mestinon.  Discussed low dose daily or alternate day prednisone. We would not want to start high as this can worsen myasthenia gravis initially. This usually starts about 4-5 days after beginning prednisone and last 4-7 days before improvement occurs. Would start low-dose 20 mg a day with increases every 3-5 days in 10-20 mg steps until the desired dose is reached. Usually takes 3-6 months before maximum benefit occurs.  Mild generalized myasthenia gravis, Azathioprine can be used. This may take 12-18 months before optimal benefit is seen.. Would start this medication at 25 mg a day with increases every 2 weeks to 50 mg, 100 mg and 150 mg a day. Some will experience flulike reaction within the first 2 weeks. Monitor ALT, AST and GGT and a complete blood count and differential weekly for the first 8 weeks and monthly thereafter. Hepatotoxicity usually mild and reversible occurs and 15% and myelosuppression and 10% about 6 weeks after starting. Both will resolve after a dose reduction but discontinuation may be required. Neutropenia is the main concern whereas lymphopenia and macrocytosis are common and benign findings that are seen with monitoring blood work with long-term use. After 6-12 months if no improvement occurs on the highest dose it can be increased in 50mg  steps every 3-6 months to a maximum of 2.5-3 mg/kg per day. This may increase the risk of dermatologic malignancies or other malignancies. For risk of previous skin cancers mycophenolate may pose a lower risk.  Mycophenolate is another option and likely  takes at least 6 months and perhaps longer to produce significant benefit. Other immunosuppressants used include cyclosporine, tacrolimus, methotrexate and cyclophosphamide. For severe disease, may consider rituximab were refractory to the usual treatments. There is evidence that mycophenolate has a more rapid onset of clinical effect.   The adverse effects of corticosteroids are numerous, well known, and largely dose-dependent. These include: hypertension, fluid retention, weight gain, potassium loss, hyperlipidemia, diabetes mellitus, osteoporosis, gastric ulceration, cataracts,  glaucoma, moon facies, obesity, acne, skin friability, juvenile growth suppression, and mood/personality changes. Individuals at particular risk for side effects include those who are diabetic or glucose intolerant, obese, hypertensive, osteoporotic or post-menopausal, and those with affective or thought disorders. An alternative immune modulator may be considered in such patients.  Discussed that should he have acute worsening especially shortness of breath patient should call 911 and be transported to the emergency room due to risks for myasthenia gravis exacerbation and respiratory failure.  Orders Placed This Encounter  Procedures  . CT CHEST W CONTRAST  . MR BRAIN W WO CONTRAST  . NCV with EMG(electromyography)   Cc: Elby Beck, FNP  Sarina Ill, MD  [facility] [address] [place], [place] [address]  Phone [phone] Fax [phone]

## 2017-04-26 NOTE — Patient Instructions (Signed)
Remember to drink plenty of fluid, eat healthy meals and do not skip any meals. Try to eat protein with a every meal and eat a healthy snack such as fruit or nuts in between meals. Try to keep a regular sleep-wake schedule and try to exercise daily, particularly in the form of walking, 20-30 minutes a day, if you can.   As far as your medications are concerned, I would like to suggest  Prednisone 20 mg taken the morning with food daily watch for GI upset. In 1-2 weeks as long as no side effects or worsening we may increase please email me. Take Mestinon (Pyridostigmine) half tablet 3 times daily. If no side effects may increase to a whole tablet 3 times daily. Please pay attention if this medication kicks in her wears off and how long in between. After workup there are other medications we may consider in place of the steroids as he do not like long-term steroids due to side effects. Other medications we might use include immunosuppressants such as  [name] or Azathioprine.  As far as diagnostic testing: MRI of the brain, CT of the chest, EMG nerve conduction study  I would like to see you back in 2-4 weeks for EMG nerve conduction study, sooner if we need to. Please call us with any interim questions, concerns, problems, updates or refill requests.    Our phone number is [phone]. We also have an after hours call service for urgent matters and there is a physician on-call for urgent questions. For any emergencies you know to call 911 or go to the nearest emergency room  Myasthenia Gravis Myasthenia gravis (MG) means severe weakness. It is a long-term (chronic) condition that causes weakness in the muscles you can control (voluntary muscles). MG can affect any voluntary muscle. The muscles most often affected are the ones that control:  Eye movement.  Facial movements.  Swallowing. MG is an autoimmune disease, which means that your body's defense system (immune system) attacks healthy  parts of your body instead of germs and other things that make you sick. When you have MG, your immune system makes proteins (antibodies) that block the chemical (acetylcholine) your body needs to send nerve signals to your muscles. This causes muscle weakness. What are the causes? The exact cause of MG is unknown. One possible cause is an enlarged thymus gland, which is located under your breastbone. What are the signs or symptoms? The earliest symptom of MG is muscle weakness that gets worse with activity and gets better after rest. Other symptoms of MG may include:  Drooping eyelids.  Double vision.  Loss of facial expression.  Trouble chewing and swallowing.  Slurred speech.  A waddling walk.  Weakness of the arms, hands, and legs. Trouble breathing is the most dangerous symptom of MG. Sudden and severe difficulty breathing (myasthenic crisis) may require emergency breathing support. This symptom sometimes happens after:  Infection.  Fever.  Drug reaction. How is this diagnosed? It can be hard to diagnose MG because muscle weakness is a common symptom in many conditions. Your health care provider will do a physical exam. You may also have tests that will help make a diagnosis. These may include:  A blood test.  A test using the medicine edrophonium. This medicine increases muscle strength by slowing the breakdown of acetylcholine.  Tests to measure nerve conduction to muscle (electromyography).  An imaging study of the chest (CT or MRI). How is this treated? Treatment can improve muscle strength. Sometimes  symptoms of MG go away for a while (remission) and you can stop treatment. Possible treatments include:  Medicine.  Removal of the thymus gland (thymectomy). This may result in a long remission for some people. Follow these instructions at home:  Take medicines only as directed by your health care provider.  Get plenty of rest to conserve your energy.  Take  frequent breaks to rest your eyes.  Maintain a healthy diet and a healthy weight.  Do not use any tobacco products including cigarettes, chewing tobacco, or electronic cigarettes. If you need help quitting, ask your health care provider.  Keep all follow-up visits as directed by your health care provider. This is important. Contact a health care provider if:  Your symptoms get worse after a fever or infection.  You have a reaction to a medicine you are taking.  Your symptoms change or get worse. Get help right away if: You have trouble breathing. This information is not intended to replace advice given to you by your health care provider. Make sure you discuss any questions you have with your health care provider. Document Released: 02/21/2001 Document Revised: 04/22/2016 Document Reviewed: 01/16/2014 Elsevier Interactive Patient Education  2017 [place].  Prednisone tablets What is this medicine? PREDNISONE (PRED ni sone) is a corticosteroid. It is commonly used to treat inflammation of the skin, joints, lungs, and other organs. Common conditions treated include asthma, allergies, and arthritis. It is also used for other conditions, such as blood disorders and diseases of the adrenal glands. This medicine may be used for other purposes; ask your health care provider or pharmacist if you have questions. COMMON BRAND NAME(S): Deltasone, Predone, Sterapred, Sterapred DS What should I tell my health care provider before I take this medicine? They need to know if you have any of these conditions: -Cushing's syndrome -diabetes -glaucoma -heart disease -high blood pressure -infection (especially a virus infection such as chickenpox, cold sores, or herpes) -kidney disease -liver disease -mental illness -myasthenia gravis -osteoporosis -seizures -stomach or intestine problems -thyroid disease -an unusual or allergic reaction to lactose, prednisone, other medicines, foods, dyes,  or preservatives -pregnant or trying to get pregnant -breast-feeding How should I use this medicine? Take this medicine by mouth with a glass of water. Follow the directions on the prescription label. Take this medicine with food. If you are taking this medicine once a day, take it in the morning. Do not take more medicine than you are told to take. Do not suddenly stop taking your medicine because you may develop a severe reaction. Your doctor will tell you how much medicine to take. If your doctor wants you to stop the medicine, the dose may be slowly lowered over time to avoid any side effects. Talk to your pediatrician regarding the use of this medicine in children. Special care may be needed. Overdosage: If you think you have taken too much of this medicine contact a poison control center or emergency room at once. NOTE: This medicine is only for you. Do not share this medicine with others. What if I miss a dose? If you miss a dose, take it as soon as you can. If it is almost time for your next dose, talk to your doctor or health care professional. You may need to miss a dose or take an extra dose. Do not take double or extra doses without advice. What may interact with this medicine? Do not take this medicine with any of the following medications: -metyrapone -mifepristone This medicine  may also interact with the following medications: -aminoglutethimide -amphotericin B -aspirin and aspirin-like medicines -barbiturates -certain medicines for diabetes, like glipizide or glyburide -cholestyramine -cholinesterase inhibitors -cyclosporine -digoxin -diuretics -ephedrine -male hormones, like estrogens and birth control pills -isoniazid -ketoconazole -NSAIDS, medicines for pain and inflammation, like ibuprofen or naproxen -phenytoin -rifampin -toxoids -vaccines -warfarin This list may not describe all possible interactions. Give your health care provider a list of all the  medicines, herbs, non-prescription drugs, or dietary supplements you use. Also tell them if you smoke, drink alcohol, or use illegal drugs. Some items may interact with your medicine. What should I watch for while using this medicine? Visit your doctor or health care professional for regular checks on your progress. If you are taking this medicine over a prolonged period, carry an identification card with your name and address, the type and dose of your medicine, and your doctor's name and address. This medicine may increase your risk of getting an infection. Tell your doctor or health care professional if you are around anyone with measles or chickenpox, or if you develop sores or blisters that do not heal properly. If you are going to have surgery, tell your doctor or health care professional that you have taken this medicine within the last twelve months. Ask your doctor or health care professional about your diet. You may need to lower the amount of salt you eat. This medicine may affect blood sugar levels. If you have diabetes, check with your doctor or health care professional before you change your diet or the dose of your diabetic medicine. What side effects may I notice from receiving this medicine? Side effects that you should report to your doctor or health care professional as soon as possible: -allergic reactions like skin rash, itching or hives, swelling of the face, lips, or tongue -changes in emotions or moods -changes in vision -depressed mood -eye pain -fever or chills, cough, sore throat, pain or difficulty passing urine -increased thirst -swelling of ankles, feet Side effects that usually do not require medical attention (report to your doctor or health care professional if they continue or are bothersome): -confusion, excitement, restlessness -headache -nausea, vomiting -skin problems, acne, thin and shiny skin -trouble sleeping -weight gain This list may not describe all  possible side effects. Call your doctor for medical advice about side effects. You may report side effects to FDA at 1-800-FDA-1088. Where should I keep my medicine? Keep out of the reach of children. Store at room temperature between 15 and 30 degrees C (59 and 86 degrees F). Protect from light. Keep container tightly closed. Throw away any unused medicine after the expiration date. NOTE: This sheet is a summary. It may not cover all possible information. If you have questions about this medicine, talk to your doctor, pharmacist, or health care provider.  2018 Elsevier/Gold Standard (2011-07-01 10:57:14)  Pyridostigmine tablets, extended-release What is this medicine? PYRIDOSTIGMINE (peer id oh STIG meen) can help with muscle strength. It is used to treat myasthenia gravis. This medicine may be used for other purposes; ask your health care provider or pharmacist if you have questions. COMMON BRAND NAME(S): Mestinon What should I tell my health care provider before I take this medicine? They need to know if you have any of these conditions: -asthma -difficulty passing urine -heart disease -infection in abdomen, peritonitis -irregular, slow heartbeat -kidney disease -seizures -stomach or bowel obstruction or ulcers -thyroid disease -an unusual or allergic reaction to pyridostigmine, bromides, other medicines, foods, dyes, or preservatives -  pregnant or trying to get pregnant -breast-feeding How should I use this medicine? Take this medicine by mouth with a glass of water. Follow the directions on the prescription label. Do not crush or chew. Take your medicine at regular intervals. Do not take your medicine more often than directed. Do not stop taking except on your doctor's advice. Talk to your pediatrician regarding the use of this medicine in children. Special care may be needed. Overdosage: If you think you have taken too much of this medicine contact a poison control center or  emergency room at once. NOTE: This medicine is only for you. Do not share this medicine with others. What if I miss a dose? If you miss a dose, take it as soon as you can. If it is almost time for your next dose, take only that dose. Do not take double or extra doses. What may interact with this medicine? Do not take this medicine with any of the following medications: -other medicines for myasthenia gravis like neostigmine -quinine This medicine may also interact with the following medications: -atropine -bethanechol -disopyramide -edrophonium -guanadrel -guanethidine -mecamylamine -medicines that block muscle or nerve pain This list may not describe all possible interactions. Give your health care provider a list of all the medicines, herbs, non-prescription drugs, or dietary supplements you use. Also tell them if you smoke, drink alcohol, or use illegal drugs. Some items may interact with your medicine. What should I watch for while using this medicine? Visit your doctor or health care professional for regular checks on your progress. Tell your doctor if your symptoms do not improve or if they get worse. Wear a medical ID bracelet or chain, and carry a card that describes your disease and details of your medicine and dosage times. What side effects may I notice from receiving this medicine? Side effects that you should report to your doctor or health care professional as soon as possible: -allergic reactions like skin rash, itching or hives, swelling of the face, lips, or tongue -breathing problems -changes in vision -muscle cramps, spasm -slow or irregular heartbeat -stomach cramps, pain -unusually weak or tired -vomiting Side effects that usually do not require medical attention (report to your doctor or health care professional if they continue or are bothersome): -diarrhea, especially at start of treatment -increased saliva -increased sweating -nausea This list may not  describe all possible side effects. Call your doctor for medical advice about side effects. You may report side effects to FDA at 1-800-FDA-1088. Where should I keep my medicine? Keep out of the reach of children. Store at room temperature between 15 and 30 degrees C (59 and 86 degrees F). Keep container tightly closed. Protect from moisture. Throw away any unused medicine after the expiration date. NOTE: This sheet is a summary. It may not cover all possible information. If you have questions about this medicine, talk to your doctor, pharmacist, or health care provider.  2018 Elsevier/Gold Standard (2008-06-21 11:19:07)  Mycophenolate capsules What is this medicine? MYCOPHENOLATE MOFETIL (mye koe FEN oh late MOE fe til) is used to decrease the immune system's response to a transplanted organ. This medicine may be used for other purposes; ask your health care provider or pharmacist if you have questions. COMMON BRAND NAME(S): [name] What should I tell my health care provider before I take this medicine? They need to know if you have any of these conditions: -anemia or other blood disorder -diarrhea -immune system problems -infection -kidney disease -phenylketonuria -stomach problems -an unusual  or allergic reaction to mycophenolate mofetil, other medicines, foods, dyes, or preservatives -pregnant or trying to get pregnant -breast-feeding How should I use this medicine? Take this medicine by mouth with a full glass of water. Follow the directions on the prescription label. Take this medicine on an empty stomach, at least 1 hour before or 2 hours after food. Do not take with food unless your doctor approves. Swallow the medicine whole. Do not cut, crush, or chew the medicine. If the medicine is broken or is not intact, do not get the powder on your skin or eyes. If contact occurs, rinse thoroughly with water. Take your medicine at regular intervals. Do not take your medicine more often than  directed. Do not stop taking except on your doctor's advice. A special MedGuide will be given to you by the pharmacist with each prescription and refill. Be sure to read this information carefully each time. Talk to your pediatrician regarding the use of this medicine in children. Special care may be needed. Overdosage: If you think you have taken too much of this medicine contact a poison control center or emergency room at once. NOTE: This medicine is only for you. Do not share this medicine with others. What if I miss a dose? If you miss a dose, take it as soon as you can. If it is almost time for your next dose, take only that dose. Do not take double or extra doses. What may interact with this medicine? -acyclovir or valacyclovir -antacids -azathioprine -birth control pills -certain antibiotics like ciprofloxacin and amoxicillin; clavulanic acid -ganciclovir or valganciclovir -lanthanum carbonate -medicines for cholesterol like cholestyramine and colestipol -metronidazole -norfloxacin -other mycophenolate medicines -probenecid -rifampin -sevelamer -vaccines This list may not describe all possible interactions. Give your health care provider a list of all the medicines, herbs, non-prescription drugs, or dietary supplements you use. Also tell them if you smoke, drink alcohol, or use illegal drugs. Some items may interact with your medicine. What should I watch for while using this medicine? Visit your doctor or health care professional for regular checks on your progress. You will need frequent blood checks during the first few months you are receiving the medicine. This medicine can make you more sensitive to the sun. Keep out of the sun. If you cannot avoid being in the sun, wear protective clothing and use sunscreen. Do not use sun lamps or tanning beds/booths. This medicine can cause birth defects. Do not get pregnant while taking this drug. Females will need to have a negative  pregnancy test before starting this medicine. If sexually active, use 2 reliable forms of birth control together for 4 weeks before starting this medicine, while you are taking this medicine, and for 6 weeks after you stop taking this medicine. Birth control pills alone may not work properly while you are taking this medicine. If you think that you might be pregnant talk to your doctor right away. If you get a cold or other infection while receiving this medicine, call your doctor or health care professional. Do not treat yourself. The medicine may decrease your body's ability to fight infections. What side effects may I notice from receiving this medicine? Side effects that you should report to your doctor or health care professional as soon as possible: -allergic reactions like skin rash, itching or hives, swelling of the face, lips, or tongue -bloody, dark, or tarry stools -changes in vision -dizziness -fever, chills or any other sign of infection -unusual bleeding or bruising -unusually weak  or tired Side effects that usually do not require medical attention (report to your doctor or health care professional if they continue or are bothersome): -constipation -diarrhea -difficulty sleeping -loss of appetite -nausea, vomiting This list may not describe all possible side effects. Call your doctor for medical advice about side effects. You may report side effects to FDA at 1-800-FDA-1088. Where should I keep my medicine? Keep out of the reach of children. Store at room temperature between 15 and 30 degrees C (59 and 86 degrees F). Throw away any unused medicine after the expiration date. NOTE: This sheet is a summary. It may not cover all possible information. If you have questions about this medicine, talk to your doctor, pharmacist, or health care provider.  2018 Elsevier/Gold Standard (2008-05-27 09:25:30)

## 2017-04-27 ENCOUNTER — Telehealth: Payer: Self-pay | Admitting: Neurology

## 2017-05-02 ENCOUNTER — Encounter: Payer: Self-pay | Admitting: Neurology

## 2017-05-02 ENCOUNTER — Other Ambulatory Visit: Payer: Self-pay | Admitting: Neurology

## 2017-05-02 MED ORDER — PREDNISONE 20 MG PO TABS: 40.0000 mg | ORAL_TABLET | Freq: Every day | ORAL | 6 refills | Status: DC

## 2017-05-10 ENCOUNTER — Ambulatory Visit
Admission: RE | Admit: 2017-05-10 | Discharge: 2017-05-10 | Disposition: A | Discharge status: Home / Self Care | Payer: BC Managed Care – PPO | Source: Ambulatory Visit | Attending: Neurology | Admitting: Neurology

## 2017-05-10 DIAGNOSIS — G7000 Myasthenia gravis without (acute) exacerbation: Secondary | ICD-10-CM

## 2017-05-10 DIAGNOSIS — H02403 Unspecified ptosis of bilateral eyelids: Secondary | ICD-10-CM

## 2017-05-10 DIAGNOSIS — D4989 Neoplasm of unspecified behavior of other specified sites: Secondary | ICD-10-CM

## 2017-05-10 DIAGNOSIS — H532 Diplopia: Secondary | ICD-10-CM | POA: Diagnosis not present

## 2017-05-10 DIAGNOSIS — H499 Unspecified paralytic strabismus: Secondary | ICD-10-CM

## 2017-05-10 DIAGNOSIS — M6289 Other specified disorders of muscle: Secondary | ICD-10-CM

## 2017-05-10 DIAGNOSIS — D150 Benign neoplasm of thymus: Secondary | ICD-10-CM

## 2017-05-10 MED ORDER — IOPAMIDOL (ISOVUE-300) INJECTION 61%
75.0000 mL | Freq: Once | INTRAVENOUS | Status: AC | PRN
  Administered 2017-05-10: 75 mL via INTRAVENOUS

## 2017-05-12 ENCOUNTER — Encounter: Payer: Self-pay | Admitting: Neurology

## 2017-05-12 NOTE — Telephone Encounter (Signed)
Pt had imaging completed 05/10/17.  CT chest IMPRESSION: 1. No acute cardiopulmonary abnormalities. No evidence for anterior mediastinal mass to suggest thymoma. 2. Two indeterminate lesions are identified within the liver. One of these is favored to represent a benign hemangioma. The other lesion has nonspecific findings. More definitive assessment with nonemergent liver protocol MRI suggested.  MRI brain IMPRESSION:  Normal MRI brain (with and without).

## 2017-05-16 ENCOUNTER — Telehealth: Payer: Self-pay

## 2017-05-16 NOTE — Telephone Encounter (Signed)
-----   Message from [name], MD sent at 05/13/2017 12:13 PM EDT ----- His MRI of the brain and CT chest normal. I am not sure he should have ptosis surgery, we need to treat the myasthenia. Make sure he has follow up with me. Incidentally, Two indeterminate lesions are identified within the liver. One of these is favored to represent a benign hemangioma. The other lesion has nonspecific findings. I think he needs to see his pcp within the next 6 weeks to discuss further testing thanks.

## 2017-05-16 NOTE — Telephone Encounter (Signed)
Eddie [name] called office in reference to NCV/EMG scheduled for this Wednesday.  Eddie [name] would like to know if he can switch the appointment to seeing Dr. Jaynee Eagles for an office visit to start on medication for his MG.  Please call

## 2017-05-16 NOTE — Telephone Encounter (Signed)
Yes, we can make that an appt instead and not an emg thanks

## 2017-05-16 NOTE — Telephone Encounter (Signed)
Called pt w/ normal MRI and CT results. Recommended that he wait on surg and f/u w/ Dr. Jaynee Eagles for myasthenia treatment. He has NCV/EMG planned later this week and will schedule f/u appt then. Also agreed to contact PCP to discuss incidental liver lesions. Voiced appreciation for call.

## 2017-05-17 NOTE — Telephone Encounter (Signed)
Returned pt TC. NCV/EMG appt changed to OV at 10 am. Pt verbalized understanding and appreciation for call.

## 2017-05-18 ENCOUNTER — Ambulatory Visit (INDEPENDENT_AMBULATORY_CARE_PROVIDER_SITE_OTHER): Payer: BC Managed Care – PPO | Admitting: Neurology

## 2017-05-18 ENCOUNTER — Encounter: Payer: Self-pay | Admitting: Neurology

## 2017-05-18 DIAGNOSIS — G7000 Myasthenia gravis without (acute) exacerbation: Secondary | ICD-10-CM | POA: Diagnosis not present

## 2017-05-18 MED ORDER — PREDNISONE 20 MG PO TABS: 60.0000 mg | ORAL_TABLET | Freq: Every day | ORAL | 6 refills | Status: DC

## 2017-05-18 MED ORDER — MYCOPHENOLATE MOFETIL 250 MG PO CAPS: 500.0000 mg | ORAL_CAPSULE | Freq: Two times a day (BID) | ORAL | 6 refills | Status: DC

## 2017-05-18 NOTE — Progress Notes (Signed)
[facility]    Provider:  Dr Jaynee Eagles Referring Provider: Elby Beck, FNP Primary Care Physician:  Elby Beck, FNP  CC:  Myasthenia gravis  Interval history 05/18/2017: Seropositive myasthenia gravis. Reviewed MRI of the brain with patient, reviewed images which were unremarkable. CT of the chest did not show thymoma or thymus tumor but discussed that there is evidence that removing any thymic tissue improves outcomes in myasthenia gravis. Patient would like to be seen by cardiothoracic surgeon. Provided literature in myasthenia gravis and thymectomy. Patient started on 40 mg of steroids. He feels improved, his strength is better, getting more repetitions in the gym, the ptosis is minimally better. The mestinon helps with the diplopia. He only takes a 1/2 pill didn't like a whole pill of mestinon. But he feels improved. Had a long discussion about seropositive myasthenia gravis,   HPI:  Eddie [name] is a 31 y.o. male here as a referral from Dr. Carlean Purl for myasthenia gravis. Patient reported diplopia and vision changes starting in February. 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 He was instructed to have thyroid tests as well as myasthenia gravis panel checked. 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 Ach: Receptor binding antibodies were positive at 0.92. Back in February he went on a ski trip and towards the end his eyes started feeling weak with trouble focusing. Father had a thyroid issue so he had testing and tsh was normal. He woke up one night nd looked at the clock on the cable box and saw 2 different times. Dr. Manuella Ghazi diagnosed him with thyrotoxicosis and then he was told he had myasthenia gravis. His right eye wanders now. He is having difficulty lifting his arms and working out, he can;t weight lift as well as he used  to. No issues swallowing but he has loss of strength. The double vision is worse in the morning and bad all day. Not better after a nap is worse. His eyes are misaligned. He has ptosis. No swallowing difficulty. No voice qualityor breathing difficulties. Feels like it is progressing. Strong family history of autoimmune disease.  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 Symptoms have gotten worse since then. He went to see an ophthalmologist who diagnosed him with thyrotoxicosis and diffuse goiter, ocular muscle weakness. He was to hold have thyroid tests and myasthenia gravis panel checked. Labs showed thyrotropin receptor autoantibodies, thyroid stimulating immunoglobulin and he was referred to endocrinology. Also reviewed endocrinology diagnosed with Graves' disease.  Ach Receptor binding antibodies were positive.   Review of Systems: Patient complains of symptoms per HPI as well as the following symptoms: No chest pain, no rashes, no shortness of breath. Pertinent negatives and positives per HPI. All others negative.    Social History   Social History  . Marital status: Single    Spouse name: N/A  . Number of children: 2  . Years of education: 16   Occupational History  . [place] History Main Topics  . Smoking status: Never Smoker  . Smokeless tobacco: Never Used  . Alcohol use 1.2 oz/week    2 Shots of liquor per week  . Drug use: No  . Sexual activity: Yes  Birth control/ protection: Condom   Other Topics Concern  . Not on file   Social History Narrative   Lives at home alone   Caffeine: 1 large coffee daily     Family History  Problem Relation Age of Onset  . Cancer Mother   . Thyroid disease Father   . Thyroid disease Brother     Past Medical  History:  Diagnosis Date  . Myasthenia gravis [facility])     Past Surgical History:  Procedure Laterality Date  . NO PAST SURGERIES      Current Outpatient Prescriptions  Medication Sig Dispense Refill  . Nutritional Supplements (PROTEIN SUPPLEMENT 80% PO) Take by mouth.    . predniSONE (DELTASONE) 20 MG tablet Take 3 tablets (60 mg total) by mouth daily with breakfast. 90 tablet 6  . pyridostigmine (MESTINON) 60 MG tablet Start with 1/2 pill three times daily and then in 1-2 weeks can increase to a whole pill three times a day 90 tablet 6  . timolol (BETIMOL) 0.25 % ophthalmic solution 1-2 drops 2 (two) times daily.    . mycophenolate ([name]) 250 MG capsule Take 2 capsules (500 mg total) by mouth 2 (two) times daily. 120 capsule 6  . valACYclovir (VALTREX) 1000 MG tablet 1/2 po qd for suppression - for an outbreak increase to 1/2 po bid for 3 days (Patient not taking: Reported on 05/18/2017) 30 tablet 5   No current facility-administered medications for this visit.     Allergies as of 05/18/2017  . (No Known Allergies)    Vitals: BP 112/64   Pulse (!) 59   Ht 5' 7.5" (1.715 m)   Wt 148 lb 6.4 oz (67.3 kg)   BMI 22.90 kg/m  Last Weight:  Wt Readings from Last 1 Encounters:  05/18/17 148 lb 6.4 oz (67.3 kg)   Last Height:   Ht Readings from Last 1 Encounters:  05/18/17 5' 7.5" (1.715 m)       Neuro: Detailed Neurologic Exam  Speech:    Speech is normal; fluent and spontaneous with normal comprehension.  Cognition:    The patient is oriented to person, place, and time;     recent and remote memory intact;     language fluent;     normal attention, concentration,     fund of knowledge Cranial Nerves:    The pupils are equal, round, and reactive to light. The fundi are normal and spontaneous venous pulsations are present. Visual fields are full to finger confrontation. Disconjugate gaze, Cannot abduct or addduct laterally or medially, can cross the midline on the  right but not on left, fatiguable upgaze, Ptosis left > right, Trigeminal sensation is intact and the muscles of mastication are normal. The face is symmetric. The palate elevates in the midline. Hearing intact. Voice is normal. Shoulder shrug is normal. The tongue has normal motion without fasciculations.   Coordination:    Normal finger to nose and heel to shin. Normal rapid alternating movements.   Gait:    Heel-toe and tandem gait are normal.   Motor Observation:    No asymmetry, no atrophy, and no involuntary movements noted. Tone:    Normal muscle tone.    Posture:    Posture is normal. normal erect    Strength:    Strength is V/V in the upper and lower limbs.      Sensation: intact to LT     Reflex Exam:  DTR's:    Deep tendon reflexes in the  upper and lower extremities are normal bilaterally.   Toes:    The toes are downgoing bilaterally.   Clonus:    Clonus is absent.    Assessment/Plan:  31 year old with seropositive myasthenia gravis, acetylcholine receptor binding antibody positive  He has ophthalmoplegia and fatigable extraocular movements with ptosis. Strength is 5 out of 5 however given patient's reports of weakness in the gym I do suspect there may be generalization of myasthenia gravis. Had a long discussion about myasthenia gravis, provided patient with documentation to read as well as reliable online sites for information. Discussed treatments as below, discussed [name], side effects, risks and lab work needed, risk for bone marrow disease, pancytopenia, hepatotoxicity and other serious sequelae. This medication can take up to 6 months for full effectiveness. In the meantime we will continue steroids at 60 mg and titrate every month by 10 mg until down to 20 mg which at that point we will titrate even slower. Discuss long-term use of steroids and the risks.  - CT of the chest w contrast to evaluate for enlarged thymus or thymoma negative. There is still  literature to show that removal of all thymus tissue improves outcomes in myasthenia gravis and we will send to cardiothoracic surgery for evaluation. - MRI of the brain to evaluate for other etiologies such as MS or other brainstem lesions causing symptoms of diplopia and opthalmoplegia was normal - EMG/NCS for repetitive nerve stimulation: patient declines, at this point given he seropositive with a negative MRI and clinical response to steroids and Mestinon I do think that patient has generalized myasthenia gravis and repetitive nerve stimulation is not necessary at this time. - Continue Mestinon 3 times a day  Prednisone 60 mg taken the morning with food daily watch for GI upset. In 1-2 weeks as long as no side effects or worsening we may increase please email me.  Take Mestinon (Pyridostigmine) half tablet 3 times daily. If no side effects may increase to a whole tablet 3 times daily. Please pay attention if this medication kicks in her wears off and how long in between.  After workup there are other medications we may consider in place of the steroids as he do not like long-term steroids due to side effects. Other medications we might use include immunosuppressants such as  [name] or Azathioprine.   - Labs at next appointment include CMP, CBC, GGT,and Thiopurine methyltransferase   We had a discussion about different medication strategies, possible adverse effects and strategies to manage them, expected time course of benefits in medication management which include hours to days for Mestinon, weeks to many months for prednisone, and many months to a year more for Azathioprine. In mild to moderate generalized myasthenia gravis, Mestinon may be the only treatment. A cholinergic crisis, in which weakness is worsened by increased doses of the Mestinon, rarely occurs. The concomitant use of prednisone and sometimes another immunosuppressant is often required. In more severe myasthenia gravis,  immunosuppression and sometimes immunomodulation should be started at the same time as Mestinon.  Will start [name], discussed side effects of patient.. Likely takes at least 6 months and perhaps longer to produce significant benefit. There is evidence that mycophenolate has a more rapid onset of clinical effect. The medicine comes in 250 mg and 500 mg tablets. The standard dose is 1000 mg twice a day. Some people need a higher dose of 1500 mg twice a day. The primary side effect that you might notice is gastrointestinal upset. To help overcome  this, you should begin the medicine at one 250 mg tablet twice a day for the first five days and increase by adding one tablet morning and evening every five days until you get to the total dose of four tablets twice a day.  Labs weekly for 4 weeks, then biweekly for 4 weeks and monthly afterwards. Discussed all side effects as per patient instructions and above including risk of rare brain disease called PML.  The adverse effects of corticosteroids are numerous, well known, and largely dose-dependent. These include: hypertension, fluid retention, weight gain, potassium loss, hyperlipidemia, diabetes mellitus, osteoporosis, gastric ulceration, cataracts, glaucoma, moon facies, obesity, acne, skin friability, juvenile growth suppression, and mood/personality changes. Individuals at particular risk for side effects include those who are diabetic or glucose intolerant, obese, hypertensive, osteoporotic or post-menopausal, and those with affective or thought disorders. An alternative immune modulator may be considered in such patients.  Discussed that should he have acute worsening especially shortness of breath patient should call 911 and be transported to the emergency room due to risks for myasthenia gravis exacerbation and respiratory failure.  Sarina Ill, MD  [facility] [address] [place], [place] [address]  Phone  [phone] Fax [phone]  A total of 40 minutes was spent face-to-face with this patient. Over half this time was spent on counseling patient on the myasthenia gravis diagnosis and different diagnostic and therapeutic options available.

## 2017-05-18 NOTE — Patient Instructions (Addendum)
Increase Prednisone to 60 a day. Every month decrease by 10mg  until reach 20mg  and hold there Start [name] as below. Will refer to cardiothoracic surgery for eval of thymectomy    The medicine comes in 250 mg and 500 mg tablets. The standard dose is 1000 mg twice a day. Some people need a higher dose of 1500 mg twice a day. The primary side effect that you might notice is gastrointestinal upset. To help overcome this, you should begin the medicine at one 250 mg tablet twice a day for the first five days and increase by adding one tablet morning and evening every five days until you get to the total dose of four tablets twice a day.   Mycophenolate capsules What is this medicine? MYCOPHENOLATE MOFETIL (mye koe FEN oh late MOE fe til) is used to decrease the immune system's response to a transplanted organ. This medicine may be used for other purposes; ask your health care provider or pharmacist if you have questions. COMMON BRAND NAME(S): [name] What should I tell my health care provider before I take this medicine? They need to know if you have any of these conditions: -anemia or other blood disorder -diarrhea -immune system problems -infection -kidney disease -phenylketonuria -stomach problems -an unusual or allergic reaction to mycophenolate mofetil, other medicines, foods, dyes, or preservatives -pregnant or trying to get pregnant -breast-feeding How should I use this medicine? Take this medicine by mouth with a full glass of water. Follow the directions on the prescription label. Take this medicine on an empty stomach, at least 1 hour before or 2 hours after food. Do not take with food unless your doctor approves. Swallow the medicine whole. Do not cut, crush, or chew the medicine. If the medicine is broken or is not intact, do not get the powder on your skin or eyes. If contact occurs, rinse thoroughly with water. Take your medicine at regular intervals. Do not take your medicine  more often than directed. Do not stop taking except on your doctor's advice. A special MedGuide will be given to you by the pharmacist with each prescription and refill. Be sure to read this information carefully each time. Talk to your pediatrician regarding the use of this medicine in children. Special care may be needed. Overdosage: If you think you have taken too much of this medicine contact a poison control center or emergency room at once. NOTE: This medicine is only for you. Do not share this medicine with others. What if I miss a dose? If you miss a dose, take it as soon as you can. If it is almost time for your next dose, take only that dose. Do not take double or extra doses. What may interact with this medicine? -acyclovir or valacyclovir -antacids -azathioprine -birth control pills -certain antibiotics like ciprofloxacin and amoxicillin; clavulanic acid -ganciclovir or valganciclovir -lanthanum carbonate -medicines for cholesterol like cholestyramine and colestipol -metronidazole -norfloxacin -other mycophenolate medicines -probenecid -rifampin -sevelamer -vaccines This list may not describe all possible interactions. Give your health care provider a list of all the medicines, herbs, non-prescription drugs, or dietary supplements you use. Also tell them if you smoke, drink alcohol, or use illegal drugs. Some items may interact with your medicine. What should I watch for while using this medicine? Visit your doctor or health care professional for regular checks on your progress. You will need frequent blood checks during the first few months you are receiving the medicine. This medicine can make you more sensitive to the  sun. Keep out of the sun. If you cannot avoid being in the sun, wear protective clothing and use sunscreen. Do not use sun lamps or tanning beds/booths. This medicine can cause birth defects. Do not get pregnant while taking this drug. Females will need to have  a negative pregnancy test before starting this medicine. If sexually active, use 2 reliable forms of birth control together for 4 weeks before starting this medicine, while you are taking this medicine, and for 6 weeks after you stop taking this medicine. Birth control pills alone may not work properly while you are taking this medicine. If you think that you might be pregnant talk to your doctor right away. If you get a cold or other infection while receiving this medicine, call your doctor or health care professional. Do not treat yourself. The medicine may decrease your body's ability to fight infections. What side effects may I notice from receiving this medicine? Side effects that you should report to your doctor or health care professional as soon as possible: -allergic reactions like skin rash, itching or hives, swelling of the face, lips, or tongue -bloody, dark, or tarry stools -changes in vision -dizziness -fever, chills or any other sign of infection -unusual bleeding or bruising -unusually weak or tired Side effects that usually do not require medical attention (report to your doctor or health care professional if they continue or are bothersome): -constipation -diarrhea -difficulty sleeping -loss of appetite -nausea, vomiting This list may not describe all possible side effects. Call your doctor for medical advice about side effects. You may report side effects to FDA at 1-800-FDA-1088. Where should I keep my medicine? Keep out of the reach of children. Store at room temperature between 15 and 30 degrees C (59 and 86 degrees F). Throw away any unused medicine after the expiration date. NOTE: This sheet is a summary. It may not cover all possible information. If you have questions about this medicine, talk to your doctor, pharmacist, or health care provider.  2018 Elsevier/Gold Standard (2008-05-27 09:25:30)  Myasthenia Gravis Myasthenia gravis (MG) means severe weakness. It is  a long-term (chronic) condition that causes weakness in the muscles you can control (voluntary muscles). MG can affect any voluntary muscle. The muscles most often affected are the ones that control:  Eye movement.  Facial movements.  Swallowing.  MG is an autoimmune disease, which means that your body's defense system (immune system) attacks healthy parts of your body instead of germs and other things that make you sick. When you have MG, your immune system makes proteins (antibodies) that block the chemical (acetylcholine) your body needs to send nerve signals to your muscles. This causes muscle weakness. What are the causes? The exact cause of MG is unknown. One possible cause is an enlarged thymus gland, which is located under your breastbone. What are the signs or symptoms? The earliest symptom of MG is muscle weakness that gets worse with activity and gets better after rest. Other symptoms of MG may include:  Drooping eyelids.  Double vision.  Loss of facial expression.  Trouble chewing and swallowing.  Slurred speech.  A waddling walk.  Weakness of the arms, hands, and legs.  Trouble breathing is the most dangerous symptom of MG. Sudden and severe difficulty breathing (myasthenic crisis) may require emergency breathing support. This symptom sometimes happens after:  Infection.  Fever.  Drug reaction.  How is this diagnosed? It can be hard to diagnose MG because muscle weakness is a common symptom in  many conditions. Your health care provider will do a physical exam. You may also have tests that will help make a diagnosis. These may include:  A blood test.  A test using the medicine edrophonium. This medicine increases muscle strength by slowing the breakdown of acetylcholine.  Tests to measure nerve conduction to muscle (electromyography).  An imaging study of the chest (CT or MRI).  How is this treated? Treatment can improve muscle strength. Sometimes symptoms  of MG go away for a while (remission) and you can stop treatment. Possible treatments include:  Medicine.  Removal of the thymus gland (thymectomy). This may result in a long remission for some people.  Follow these instructions at home:  Take medicines only as directed by your health care provider.  Get plenty of rest to conserve your energy.  Take frequent breaks to rest your eyes.  Maintain a healthy diet and a healthy weight.  Do not use any tobacco products including cigarettes, chewing tobacco, or electronic cigarettes. If you need help quitting, ask your health care provider.  Keep all follow-up visits as directed by your health care provider. This is important. Contact a health care provider if:  Your symptoms get worse after a fever or infection.  You have a reaction to a medicine you are taking.  Your symptoms change or get worse. Get help right away if: You have trouble breathing. This information is not intended to replace advice given to you by your health care provider. Make sure you discuss any questions you have with your health care provider. Document Released: 02/21/2001 Document Revised: 04/22/2016 Document Reviewed: 01/16/2014 Elsevier Interactive Patient Education  [employer].

## 2017-05-19 ENCOUNTER — Telehealth: Payer: Self-pay | Admitting: *Deleted

## 2017-05-19 LAB — COMPREHENSIVE METABOLIC PANEL
ALBUMIN: 4.4 g/dL (ref 3.5–5.5)
ALT: 29 IU/L (ref 0–44)
AST: 27 IU/L (ref 0–40)
Albumin/Globulin Ratio: 1.5 (ref 1.2–2.2)
Alkaline Phosphatase: 39 IU/L (ref 39–117)
BUN / CREAT RATIO: 21 — AB (ref 9–20)
BUN: 23 mg/dL — AB (ref 6–20)
Bilirubin Total: 0.5 mg/dL (ref 0.0–1.2)
CO2: 25 mmol/L (ref 20–29)
CREATININE: 1.08 mg/dL (ref 0.76–1.27)
Calcium: 10 mg/dL (ref 8.7–10.2)
Chloride: 101 mmol/L (ref 96–106)
GFR calc non Af Amer: 90 mL/min/{1.73_m2} (ref >59)
GFR, EST AFRICAN AMERICAN: 104 mL/min/{1.73_m2} (ref >59)
GLUCOSE: 98 mg/dL (ref 65–99)
Globulin, Total: 2.9 g/dL (ref 1.5–4.5)
Potassium: 4.7 mmol/L (ref 3.5–5.2)
Sodium: 141 mmol/L (ref 134–144)
Total Protein: 7.3 g/dL (ref 6.0–8.5)

## 2017-05-19 LAB — CBC WITH DIFFERENTIAL/PLATELET
Basophils Absolute: 0 10*3/uL (ref 0.0–0.2)
Basos: 1 %
EOS (ABSOLUTE): 0.1 10*3/uL (ref 0.0–0.4)
EOS: 2 %
HEMOGLOBIN: 14.8 g/dL (ref 13.0–17.7)
Hematocrit: 44.3 % (ref 37.5–51.0)
Immature Grans (Abs): 0 10*3/uL (ref 0.0–0.1)
Immature Granulocytes: 1 %
LYMPHS ABS: 1.1 10*3/uL (ref 0.7–3.1)
Lymphs: 30 %
MCH: 29.6 pg (ref 26.6–33.0)
MCHC: 33.4 g/dL (ref 31.5–35.7)
MCV: 89 fL (ref 79–97)
MONOCYTES: 8 %
Monocytes Absolute: 0.3 10*3/uL (ref 0.1–0.9)
NEUTROS ABS: 2.3 10*3/uL (ref 1.4–7.0)
Neutrophils: 58 %
Platelets: 166 10*3/uL (ref 150–379)
RBC: 5 x10E6/uL (ref 4.14–5.80)
RDW: 13.9 % (ref 12.3–15.4)
WBC: 3.8 10*3/uL (ref 3.4–10.8)

## 2017-05-19 NOTE — Telephone Encounter (Signed)
Spoke with patient and informed him that his labs look fine. Advised he does havesome very mild dehydration, and he needs to increase fluids, particularly in the heat. He verbalized understanding, appreciation.

## 2017-06-02 ENCOUNTER — Encounter: Payer: BC Managed Care – PPO | Admitting: Neurology

## 2017-06-21 ENCOUNTER — Telehealth: Payer: Self-pay | Admitting: Family Medicine

## 2017-06-21 NOTE — Telephone Encounter (Signed)
Called and left voicemail on Trina's voicemail for a return call.

## 2017-06-21 NOTE — Telephone Encounter (Signed)
[facility] Request needs dx code for thyroid stimulating IG test. Call to advise.

## 2017-06-23 NOTE — Telephone Encounter (Signed)
[place] Please call back w/ supportive diagnosis code.  Thank you,  -LL

## 2017-06-24 NOTE — Telephone Encounter (Signed)
Called and spoke with billing. Representative  states she doesn't see anything in the system with this patient. Explained that I have received calls from both [place] provided extension and unable to be transferred. Will wait to get a return call from [facility].

## 2017-06-28 NOTE — Telephone Encounter (Signed)
Faxed dx code E05.00 back to [facility].

## 2017-06-30 ENCOUNTER — Telehealth: Payer: Self-pay | Admitting: Neurology

## 2017-06-30 DIAGNOSIS — G7000 Myasthenia gravis without (acute) exacerbation: Secondary | ICD-10-CM

## 2017-06-30 MED ORDER — MYCOPHENOLATE MOFETIL 500 MG PO TABS: 1000.0000 mg | ORAL_TABLET | Freq: Two times a day (BID) | ORAL | 1 refills | Status: DC

## 2017-06-30 NOTE — Telephone Encounter (Signed)
Refills e-scribed to mail order pharmacy per request.

## 2017-06-30 NOTE — Addendum Note (Signed)
Addended by: Monte Fantasia on: 06/30/2017 01:59 PM   Modules accepted: Orders

## 2017-06-30 NOTE — Telephone Encounter (Signed)
Jamie Retail buyer) from [facility] called asking for verba re: medication mycophenolate ([name]) 250 MG capsule, they never received the prescription.  Roselyn Reef stated the # for the pharmacy is [phone], please call

## 2018-01-03 ENCOUNTER — Other Ambulatory Visit: Payer: Self-pay | Admitting: Neurology

## 2018-05-04 ENCOUNTER — Other Ambulatory Visit: Payer: Self-pay | Admitting: Neurology

## 2018-05-05 ENCOUNTER — Encounter: Payer: Self-pay | Admitting: Neurology

## 2018-05-05 ENCOUNTER — Other Ambulatory Visit: Payer: Self-pay | Admitting: Neurology

## 2018-05-05 DIAGNOSIS — G7000 Myasthenia gravis without (acute) exacerbation: Secondary | ICD-10-CM

## 2018-05-24 ENCOUNTER — Other Ambulatory Visit: Payer: Self-pay | Admitting: Neurology

## 2018-05-24 ENCOUNTER — Telehealth: Payer: Self-pay | Admitting: Neurology

## 2018-05-24 NOTE — Progress Notes (Signed)
Error

## 2018-05-24 NOTE — Telephone Encounter (Signed)
Eddie [name], patient needs a repeat CT of the thoracic spine before cardiothoracic surgery will see him. Would you ask patient if he is ok with that and let me know so I can order it thanks. He also needs a follow up within the next 3 months, if he hasn;t scheduled please do so he was supposed to followup in 3 months after the first appointment thanks

## 2018-05-24 NOTE — Telephone Encounter (Signed)
Called pt & LVM (ok per DPR) asking for call back to discuss imaging needed for cardiothoracic surgery as well as scheduling a f/u appt. Left office number in message.

## 2018-05-26 NOTE — Telephone Encounter (Signed)
Called pt again & LVM asking for call back to discuss CT of the thoracic spine that is needed before cardiothoracic surgery will see him. Also we would like to schedule a f/u appointment. Left office number and office hours in message.

## 2018-06-05 NOTE — Telephone Encounter (Signed)
Spoke with pt. Discussed Dr. Cathren Laine message that CT of the thoracic spine would need to be repeated before cardiothoracic surgery will see him. Pt said he would prefer if they could use the first one. RN advised she would let Dr. Jaynee Eagles know of his request but unsure if this was possible. Pt appreciative if we would ask. Also discussed that Dr. Jaynee Eagles wanted to see him within the next 3 months. He agreed but said he would call back to schedule because he is on the way to a funeral at this time.

## 2018-06-05 NOTE — Telephone Encounter (Signed)
Dr. Jaynee Eagles aware and stated that cardiothoracic will need a repeat CT of thoracic spine or they will not see him.

## 2018-06-06 NOTE — Telephone Encounter (Signed)
Called pt and LVM (ok per DPR) letting him know that Dr. Jaynee Eagles says cardiothoracic surgery will need the repeat CT thoracic spine or they won't see him. Also asked for call back to schedule a f/u appointment to be seen within 3 months. Left office number in message.

## 2018-07-17 ENCOUNTER — Telehealth: Payer: Self-pay | Admitting: Neurology

## 2018-07-17 ENCOUNTER — Other Ambulatory Visit: Payer: Self-pay | Admitting: Neurology

## 2018-07-17 DIAGNOSIS — D4989 Neoplasm of unspecified behavior of other specified sites: Secondary | ICD-10-CM

## 2018-07-17 DIAGNOSIS — G7000 Myasthenia gravis without (acute) exacerbation: Secondary | ICD-10-CM

## 2018-07-17 DIAGNOSIS — D150 Benign neoplasm of thymus: Principal | ICD-10-CM

## 2018-07-17 NOTE — Telephone Encounter (Signed)
Eddie [name]/[facility] [phone] called they need CT for provider to look at. Please call to advise

## 2018-07-17 NOTE — Telephone Encounter (Signed)
[employer] Auth: [id number] (exp. 07/17/18 to 08/15/18) order sent to GI. They will reach out to the pt to schedule.

## 2018-07-17 NOTE — Telephone Encounter (Signed)
I have not seen patient in over a year. Please send an email and let him know I can no longer treat him if he does not follow medica management and come for followup, he should be having regular labwork done. I will discontinue his medication in 30 days and dismiss him from the practice if he does not make a follow up appointment thanks

## 2018-07-18 ENCOUNTER — Telehealth: Payer: Self-pay | Admitting: Neurology

## 2018-07-18 NOTE — Addendum Note (Signed)
Addended by: Gildardo Griffes on: 07/18/2018 01:39 PM   Modules accepted: Orders

## 2018-07-18 NOTE — Telephone Encounter (Signed)
Noted thank you

## 2018-07-18 NOTE — Telephone Encounter (Signed)
I called patient today and left a message. He has not follows medical management. He has not followed up in our office for over a year. Not returning phone calls. Patient will be dismissed from the practice.   Levada Dy please dismiss from practice for noncompliance thanks

## 2018-07-18 NOTE — Telephone Encounter (Signed)
He isnt scheduled yet.. I think you can cancel the order out.

## 2018-07-18 NOTE — Progress Notes (Signed)
Order for CT chest canceled per v.o. Dr. Jaynee Eagles. Pt is being dismissed from practice.

## 2018-07-18 NOTE — Telephone Encounter (Signed)
Raquel Sarna, Please cancel patient's CT chest, he is being dismissed from the practice thanks.

## 2018-07-18 NOTE — Telephone Encounter (Signed)
Angie, please dismiss patient for non compliance thanks

## 2018-07-18 NOTE — Telephone Encounter (Signed)
Order has been canceled per v.o. Dr. Jaynee Eagles.

## 2018-07-18 NOTE — Telephone Encounter (Signed)
noted 

## 2018-07-19 NOTE — Telephone Encounter (Signed)
Called pt and LVM with office number asking for call back.  

## 2018-07-19 NOTE — Telephone Encounter (Signed)
Shirlean Mylar, would you discuss with patient? I am sorry he is having financial difficulties, he has never informed us of this. Regardless, we have called him multiple occassions and he does not return our calls  and this is something that is not caused by financial problems. I cannot treat his myasthenia gravis if he is not seen regularly every 3 months as previously instructed and I cannot provide medication and medical management  if he does not comply with regular blood testing as these medications can be dangerous and need regular monitoring. This has been ongoing for over a year. He needs to see his primary care and get his neurologic care transferred elsewhere thank you.

## 2018-07-19 NOTE — Telephone Encounter (Signed)
Patient is calling to discuss why he has been dismissed and his financial problems which has caused him to be non compliant.

## 2018-07-20 NOTE — Telephone Encounter (Signed)
Spoke to the patient.  He was very polite and apologetic on the phone.  He explained that he had financial issues and a lot of stress.  He apologize for not returning her calls.  I did explain the importance of communicating with our office considering his diagnosis.  I discussed with Dr. Jaynee Eagles and she is amenable to seeing the patient again providing that he follows through with her recommendations and communicates appropriately with our office.  Patient is amenable to this plan.

## 2018-08-03 ENCOUNTER — Encounter: Payer: Self-pay | Admitting: Neurology

## 2018-08-03 ENCOUNTER — Ambulatory Visit: Payer: BC Managed Care – PPO | Admitting: Neurology

## 2018-08-03 VITALS — BP 118/81 | HR 65 | Ht 68.0 in | Wt 150.0 lb

## 2018-08-03 DIAGNOSIS — E320 Persistent hyperplasia of thymus: Secondary | ICD-10-CM

## 2018-08-03 DIAGNOSIS — Z79899 Other long term (current) drug therapy: Secondary | ICD-10-CM

## 2018-08-03 DIAGNOSIS — D4989 Neoplasm of unspecified behavior of other specified sites: Secondary | ICD-10-CM

## 2018-08-03 DIAGNOSIS — D150 Benign neoplasm of thymus: Secondary | ICD-10-CM

## 2018-08-03 DIAGNOSIS — G7000 Myasthenia gravis without (acute) exacerbation: Secondary | ICD-10-CM | POA: Diagnosis not present

## 2018-08-03 MED ORDER — MYCOPHENOLATE MOFETIL 500 MG PO TABS: 1500.0000 mg | ORAL_TABLET | Freq: Two times a day (BID) | ORAL | 3 refills | Status: DC

## 2018-08-03 NOTE — Progress Notes (Signed)
[facility]    Provider:  Dr Jaynee Eagles Referring Provider: Elby Beck, FNP Primary Care Physician:  Elby Beck, FNP  CC:  Myasthenia gravis  Interval history 08/03/2018: He has not been here for over a year and not returned our calls. He has been well. He has experienced more opthalmoplegia when off of the prednisone but at 40mg  with the cellcept he does well. Will increase cellcept. Discussed we really need to decrease his steroids long-term at this dose (or any dose) can cause significant sequelae. Discussed he should be on calcium and vitamin D. Also needs repeat CT chest and eval to cardiothoracic for removal of thymic tissue. Needs labs today and again in 3-4 months. And he needs to be more compliant with his appointments, he has not been here in over a year and has not returned multiple of our calls.  Interval history 05/18/2017: Seropositive myasthenia gravis. Reviewed MRI of the brain with patient, reviewed images which were unremarkable. CT of the chest did not show thymoma or thymus tumor but discussed that there is evidence that removing any thymic tissue improves outcomes in myasthenia gravis. Patient would like to be seen by cardiothoracic surgeon. Provided literature in myasthenia gravis and thymectomy. Patient started on 40 mg of steroids. He feels improved, his strength is better, getting more repetitions in the gym, the ptosis is minimally better. The mestinon helps with the diplopia. He only takes a 1/2 pill didn't like a whole pill of mestinon. But he feels improved. Had a long discussion about seropositive myasthenia gravis,   HPI:  Eddie [name] is a 32 y.o. male here as a referral from Dr. Carlean Purl for myasthenia gravis. Patient reported diplopia and vision changes starting in February. 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 He was instructed to have thyroid tests as well as myasthenia  gravis panel checked. 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 Ach: Receptor binding antibodies were positive at 0.92. Back in February he went on a ski trip and towards the end his eyes started feeling weak with trouble focusing. Father had a thyroid issue so he had testing and tsh was normal. He woke up one night nd looked at the clock on the cable box and saw 2 different times. Dr. Manuella Ghazi diagnosed him with thyrotoxicosis and then he was told he had myasthenia gravis. His right eye wanders now. He is having difficulty lifting his arms and working out, he can;t weight lift as well as he used to. No issues swallowing but he has loss of strength. The double vision is worse in the morning and bad all day. Not better after a nap is worse. His eyes are misaligned. He has ptosis. No swallowing difficulty. No voice qualityor breathing difficulties. Feels like it is progressing. Strong family history of autoimmune disease.  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 Symptoms have gotten worse since then. He went to see an ophthalmologist who diagnosed him with thyrotoxicosis and diffuse goiter, ocular muscle weakness. He was to hold have thyroid tests and myasthenia gravis panel checked. Labs showed thyrotropin receptor autoantibodies, thyroid stimulating immunoglobulin and he was referred to endocrinology. Also reviewed endocrinology diagnosed with Graves' disease.  Ach Receptor binding antibodies were positive.  Review of Systems: Patient complains of symptoms per HPI as well as the following symptoms: No chest pain, no rashes, no shortness of breath. Pertinent negatives and positives per HPI. All others negative.    Social History    Socioeconomic History  . Marital status: Single    Spouse name: Not on file  . Number of children: 2  . Years of education: 20  . Highest education level: Not on file  Occupational History  . Occupation: [employer]  . Financial resource strain: Not on file  . Food insecurity:    Worry: Not on file    Inability: Not on file  . Transportation needs:    Medical: Not on file    Non-medical: Not on file  Tobacco Use  . Smoking status: Never Smoker  . Smokeless tobacco: Never Used  Substance and Sexual Activity  . Alcohol use: Yes    Alcohol/week: 2.0 standard drinks    Types: 2 Shots of liquor per week  . Drug use: No  . Sexual activity: Yes    Birth control/protection: Condom  Lifestyle  . Physical activity:    Days per week: Not on file    Minutes per session: Not on file  . Stress: Not on file  Relationships  . Social connections:    Talks on phone: Not on file    Gets together: Not on file    Attends religious service: Not on file    Active member of club or organization: Not on file    Attends meetings of clubs or organizations: Not on file    Relationship status: Not on file  . Intimate partner violence:    Fear of current or ex partner: Not on file    Emotionally abused: Not on file    Physically abused: Not on file    Forced sexual activity: Not on file  Other Topics Concern  . Not on file  Social History Narrative   Lives at home alone   Caffeine: 1 large coffee daily    Right handed    Family History  Problem Relation Age of Onset  . Cancer Mother   . Thyroid disease Father   . Thyroid disease Brother     Past Medical History:  Diagnosis Date  . Myasthenia gravis [facility])     Past Surgical History:  Procedure Laterality Date  . NO PAST SURGERIES      Current Outpatient Medications  Medication Sig Dispense Refill  . mycophenolate ([name]) 500 MG tablet Take 3 tablets (1,500 mg total) by mouth 2 (two) times daily.  540 tablet 3  . predniSONE (DELTASONE) 20 MG tablet TAKE 2 TABLETS BY MOUTH ONCE DAILY WITH  BREAKFAST. 60 tablet 6  . timolol (BETIMOL) 0.25 % ophthalmic solution 1-2 drops 2 (two) times daily.    . Nutritional Supplements (PROTEIN SUPPLEMENT 80% PO) Take by mouth.    . valACYclovir (VALTREX) 1000 MG tablet 1/2 po qd for suppression - for an outbreak increase to 1/2 po bid for 3 days (Patient not taking: Reported on 05/18/2017) 30 tablet 5   No current facility-administered medications for this visit.     Allergies as of 08/03/2018  . (No Known Allergies)    Vitals: BP 118/81 (BP Location: Right Arm, Patient Position: Sitting)   Pulse 65   Ht 5\' 8"  (1.727 m)   Wt 150 lb (68 kg)   BMI 22.81 kg/m  Last Weight:  Wt Readings from Last 1  Encounters:  08/03/18 150 lb (68 kg)   Last Height:   Ht Readings from Last 1 Encounters:  08/03/18 5\' 8"  (1.727 m)       Neuro: Detailed Neurologic Exam  Speech:    Speech is normal; fluent and spontaneous with normal comprehension.  Cognition:    The patient is oriented to person, place, and time;     recent and remote memory intact;     language fluent;     normal attention, concentration,     fund of knowledge Cranial Nerves:    The pupils are equal, round, and reactive to light. The fundi are normal and spontaneous venous pulsations are present. Visual fields are full to finger confrontation. Disconjugate gaze, Cannot abduct or addduct laterally or medially, can cross the midline on the right but not on left, fatiguable upgaze, Ptosis left > right, Trigeminal sensation is intact and the muscles of mastication are normal. The face is symmetric. The palate elevates in the midline. Hearing intact. Voice is normal. Shoulder shrug is normal. The tongue has normal motion without fasciculations.   Coordination:    Normal finger to nose and heel to shin. Normal rapid alternating movements.   Gait:    Heel-toe and tandem gait are normal.     Motor Observation:    No asymmetry, no atrophy, and no involuntary movements noted. Tone:    Normal muscle tone.    Posture:    Posture is normal. normal erect    Strength:    Strength is V/V in the upper and lower limbs.      Sensation: intact to LT     Reflex Exam:  DTR's:    Deep tendon reflexes in the upper and lower extremities are normal bilaterally.   Toes:    The toes are downgoing bilaterally.   Clonus:    Clonus is absent.    Assessment/Plan:  32 year old with seropositive myasthenia gravis, acetylcholine receptor binding antibody positive  At initial exam he had ophthalmoplegia and fatigable extraocular movements with ptosis but exam normal today. Strength is 5 out of 5 however given patient's reports of weakness in the gym I do suspect there may be generalization of myasthenia gravis. Had a long discussion about myasthenia gravis, provided patient with documentation to read as well as reliable online sites for information. Discussed treatments as below, started [name], side effects, risks and lab work needed, risk for bone marrow disease, pancytopenia, hepatotoxicity and other serious sequelae.  Discuss long-term use of steroids and the risks.   - patient has not been back for over a year and did not return many of our calls. We called to dismiss him from our practice and he apologized and asked to come back so I will see him. - He is on cellcept. But he never titrated down on the prednisone - he has a CT chest will send to cardiothoracic surgery. - There is literature to show that removal of all thymus tissue improves outcomes in myasthenia gravis and we will send to cardiothoracic surgery for evaluation. - MRI of the brain to evaluate for other etiologies such as MS or other brainstem lesions causing symptoms of diplopia and opthalmoplegia was normal - EMG/NCS for repetitive nerve stimulation: patient declines, at this point given he seropositive with a  negative MRI and clinical response to steroids and Mestinon I do think that patient has generalized myasthenia gravis and repetitive nerve stimulation is not necessary at this time.  - Mestinon 3 times a day  as needed  - Increase [name] to 1500mg  bid. Repeat labs in 3 months. F/u 6 months. Decrease steroids by 10mg  every month if symptoms reappear go back to previous dose and emal/call the office.  -cbc, cmp, hgbaic, take vitamin D and calcium   We had a discussion about different medication strategies, possible adverse effects and strategies to manage them, expected time course of benefits in medication management which include hours to days for Mestinon, weeks to many months for prednisone, and many months to a year more for Azathioprine. In mild to moderate generalized myasthenia gravis, Mestinon may be the only treatment. A cholinergic crisis, in which weakness is worsened by increased doses of the Mestinon, rarely occurs. The concomitant use of prednisone and sometimes another immunosuppressant is often required. In more severe myasthenia gravis, immunosuppression and sometimes immunomodulation should be started at the same time as Mestinon.  Will increase [name], discussed side effects of patient.. Likely takes at least 6 months and perhaps longer to produce significant benefit. There is evidence that mycophenolate has a more rapid onset of clinical effect. The medicine comes in 250 mg and 500 mg tablets. The standard dose is 1000 mg twice a day. Some people need a higher dose of 1500 mg twice a day. The primary side effect that you might notice is gastrointestinal upset  Discussed all side effects as per patient instructions and above including risk of rare brain disease called PML.  The adverse effects of corticosteroids are numerous, well known, and largely dose-dependent. These include: hypertension, fluid retention, weight gain, potassium loss, hyperlipidemia, diabetes mellitus,  osteoporosis, gastric ulceration, cataracts, glaucoma, moon facies, obesity, acne, skin friability, juvenile growth suppression, and mood/personality changes.  Discussed again that should he have acute worsening especially shortness of breath patient should call 911 and be transported to the emergency room due to risks for myasthenia gravis exacerbation and respiratory failure.  Orders Placed This Encounter  Procedures  . CT CHEST W CONTRAST  . CBC with Differential/Platelets  . Comprehensive metabolic panel  . Hemoglobin A1c  . Ambulatory referral to Cardiothoracic Surgery     Sarina Ill, MD  [facility] [address] [place], [place] [address]  Phone [phone] Fax [phone]  A total of 40 minutes was spent face-to-face with this patient. Over half this time was spent on counseling patient on the myasthenia gravis diagnosis and different diagnostic and therapeutic options available.

## 2018-08-03 NOTE — Patient Instructions (Addendum)
-Increase cellcept to 1500mg  twice daily (3 pills twice daily) -Stay on prednisone 40mg  for one month. Then decrease to 30mg  for one month. Then decrease to 20mg  for one month. Then try to go down to 10mg . Email me as you go if you have to return to a higher dose. - I ordered the CT chest but will try to find a thoracic surgeon who will see you with the old imaging - labs  Myasthenia Gravis Myasthenia gravis (MG) means severe weakness. It is a long-term (chronic) condition that causes weakness in the muscles you can control (voluntary muscles). MG can affect any voluntary muscle. The muscles most often affected are the ones that control:  Eye movement.  Facial movements.  Swallowing.  MG is an autoimmune disease, which means that your body's defense system (immune system) attacks healthy parts of your body instead of germs and other things that make you sick. When you have MG, your immune system makes proteins (antibodies) that block the chemical (acetylcholine) your body needs to send nerve signals to your muscles. This causes muscle weakness. What are the causes? The exact cause of MG is unknown. One possible cause is an enlarged thymus gland, which is located under your breastbone. What are the signs or symptoms? The earliest symptom of MG is muscle weakness that gets worse with activity and gets better after rest. Other symptoms of MG may include:  Drooping eyelids.  Double vision.  Loss of facial expression.  Trouble chewing and swallowing.  Slurred speech.  A waddling walk.  Weakness of the arms, hands, and legs.  Trouble breathing is the most dangerous symptom of MG. Sudden and severe difficulty breathing (myasthenic crisis) may require emergency breathing support. This symptom sometimes happens after:  Infection.  Fever.  Drug reaction.  How is this diagnosed? It can be hard to diagnose MG because muscle weakness is a common symptom in many conditions. Your health  care provider will do a physical exam. You may also have tests that will help make a diagnosis. These may include:  A blood test.  A test using the medicine edrophonium. This medicine increases muscle strength by slowing the breakdown of acetylcholine.  Tests to measure nerve conduction to muscle (electromyography).  An imaging study of the chest (CT or MRI).  How is this treated? Treatment can improve muscle strength. Sometimes symptoms of MG go away for a while (remission) and you can stop treatment. Possible treatments include:  Medicine.  Removal of the thymus gland (thymectomy). This may result in a long remission for some people.  Follow these instructions at home:  Take medicines only as directed by your health care provider.  Get plenty of rest to conserve your energy.  Take frequent breaks to rest your eyes.  Maintain a healthy diet and a healthy weight.  Do not use any tobacco products including cigarettes, chewing tobacco, or electronic cigarettes. If you need help quitting, ask your health care provider.  Keep all follow-up visits as directed by your health care provider. This is important. Contact a health care provider if:  Your symptoms get worse after a fever or infection.  You have a reaction to a medicine you are taking.  Your symptoms change or get worse. Get help right away if: You have trouble breathing. This information is not intended to replace advice given to you by your health care provider. Make sure you discuss any questions you have with your health care provider. Document Released: 02/21/2001 Document Revised: 04/22/2016  Document Reviewed: 01/16/2014 Elsevier Interactive Patient Education  [employer].

## 2018-08-04 ENCOUNTER — Other Ambulatory Visit: Payer: Self-pay | Admitting: Neurology

## 2018-08-04 ENCOUNTER — Telehealth: Payer: Self-pay | Admitting: Neurology

## 2018-08-04 LAB — COMPREHENSIVE METABOLIC PANEL
A/G RATIO: 1.9 (ref 1.2–2.2)
ALT: 21 IU/L (ref 0–44)
AST: 22 IU/L (ref 0–40)
Albumin: 4.6 g/dL (ref 3.5–5.5)
Alkaline Phosphatase: 50 IU/L (ref 39–117)
BILIRUBIN TOTAL: 0.4 mg/dL (ref 0.0–1.2)
BUN / CREAT RATIO: 12 (ref 9–20)
BUN: 13 mg/dL (ref 6–20)
CHLORIDE: 100 mmol/L (ref 96–106)
CO2: 23 mmol/L (ref 20–29)
Calcium: 9.7 mg/dL (ref 8.7–10.2)
Creatinine, Ser: 1.09 mg/dL (ref 0.76–1.27)
GFR calc non Af Amer: 89 mL/min/{1.73_m2} (ref >59)
GFR, EST AFRICAN AMERICAN: 103 mL/min/{1.73_m2} (ref >59)
Globulin, Total: 2.4 g/dL (ref 1.5–4.5)
Glucose: 103 mg/dL — ABNORMAL HIGH (ref 65–99)
POTASSIUM: 3.7 mmol/L (ref 3.5–5.2)
Sodium: 142 mmol/L (ref 134–144)
TOTAL PROTEIN: 7 g/dL (ref 6.0–8.5)

## 2018-08-04 LAB — CBC WITH DIFFERENTIAL/PLATELET
BASOS: 1 %
Basophils Absolute: 0 10*3/uL (ref 0.0–0.2)
EOS (ABSOLUTE): 0.1 10*3/uL (ref 0.0–0.4)
Eos: 2 %
Hematocrit: 44.9 % (ref 37.5–51.0)
Hemoglobin: 14.5 g/dL (ref 13.0–17.7)
IMMATURE GRANS (ABS): 0 10*3/uL (ref 0.0–0.1)
Immature Granulocytes: 0 %
LYMPHS ABS: 1.7 10*3/uL (ref 0.7–3.1)
LYMPHS: 33 %
MCH: 28.5 pg (ref 26.6–33.0)
MCHC: 32.3 g/dL (ref 31.5–35.7)
MCV: 88 fL (ref 79–97)
MONOS ABS: 0.5 10*3/uL (ref 0.1–0.9)
Monocytes: 9 %
NEUTROS ABS: 2.8 10*3/uL (ref 1.4–7.0)
Neutrophils: 55 %
PLATELETS: 158 10*3/uL (ref 150–450)
RBC: 5.09 x10E6/uL (ref 4.14–5.80)
RDW: 12.5 % (ref 12.3–15.4)
WBC: 5.2 10*3/uL (ref 3.4–10.8)

## 2018-08-04 LAB — HEMOGLOBIN A1C
Est. average glucose Bld gHb Est-mCnc: 126 mg/dL
Hgb A1c MFr Bld: 6 % — ABNORMAL HIGH (ref 4.8–5.6)

## 2018-08-04 MED ORDER — MYCOPHENOLATE MOFETIL 500 MG PO TABS: 1500.0000 mg | ORAL_TABLET | Freq: Two times a day (BID) | ORAL | 3 refills | Status: DC

## 2018-08-04 NOTE — Telephone Encounter (Signed)
Patient is now prediabetic. We really need to get him off of the steroids as we discussed yesterday. I called in the increased [name] (mycophenolate) to [place], [place] - [address] and want to make sure he picks it up. I called them today and they said his last cellcept prescription was transferred to a different store [place] so they are not sure what to do. If he has difficulty getting the 1500mg  bid cellcept he needs to call us as it is this medication that will help get him off of steroids.   I called the [facility] and they told me he has not been filling his cellcept. He told us he was taking the cellcept (mycophenolate) and was compliant. Can you ask which particular pharmacy (enough info so I can call and get a history of refills) he has been refilling the [name] prescription?   If patient does not answer do not leave detailed message just ask to call us back

## 2018-08-07 ENCOUNTER — Telehealth: Payer: Self-pay | Admitting: Neurology

## 2018-08-07 NOTE — Telephone Encounter (Signed)
[employer] Auth: [id number] (exp. 08/07/18 to 09/05/18) order ent to GI. They will reach out to the pt to schedule.

## 2018-08-09 NOTE — Telephone Encounter (Signed)
Spoke with the patient on Friday 9/6 and he informed me that he had been getting the medication filled through a specialty pharmacy and it was shipped to [facility] [place] and that is where he had been picking it up. I informed the patient of making sure he had been taking the medication because if he had not been taking it, it could be harmful to him to increase the dose. Dr Jaynee Eagles advised me to call the pharmacy and verify for the patient and make sure pt is compliant with medication. I was able to make contact with the pharmacy and confirmed with the pharmacy the patient had last shipment sent on 05/19/18 and [facility] [address] [place]. I will make Dr Jaynee Eagles aware of this finding.

## 2019-02-05 ENCOUNTER — Ambulatory Visit: Payer: BC Managed Care – PPO | Admitting: Neurology

## 2019-02-14 ENCOUNTER — Telehealth: Payer: Self-pay | Admitting: *Deleted

## 2019-02-14 NOTE — Telephone Encounter (Signed)
Decrease to 5mg  prednisone. Continue [name] 1500mg  bid an will see him in 4 weeks thanks

## 2019-02-14 NOTE — Telephone Encounter (Signed)
Dr. Jaynee Eagles called pt d/t risks with current virus going around. Plan to make prednisone adjustment and r/s in 1 month.

## 2019-02-15 ENCOUNTER — Ambulatory Visit: Payer: BC Managed Care – PPO | Admitting: Neurology

## 2019-03-22 ENCOUNTER — Telehealth: Payer: Self-pay | Admitting: *Deleted

## 2019-03-22 ENCOUNTER — Encounter: Payer: Self-pay | Admitting: *Deleted

## 2019-03-22 NOTE — Telephone Encounter (Signed)
Called pt and discussed that d/t COVID19 our office is doing alternative visits instead. I offered a telephone visit with Dr.Ahern at the same time as scheduled 4/30 @ 4 pm. Advised pt that the phone visit will be filed with his insurance so there may be charges r/t the appt. He verbalized understanding and consented. Also confirmed pt using 2 identifiers and updated his chart (meds, history, weight, etc). He is aware that he will receive a call about 30 minutes prior to his appt to check-in. He verbalized appreciation.

## 2019-03-29 ENCOUNTER — Other Ambulatory Visit: Payer: Self-pay

## 2019-03-29 ENCOUNTER — Ambulatory Visit (INDEPENDENT_AMBULATORY_CARE_PROVIDER_SITE_OTHER): Payer: BC Managed Care – PPO | Admitting: Neurology

## 2019-03-29 DIAGNOSIS — G7001 Myasthenia gravis with (acute) exacerbation: Secondary | ICD-10-CM | POA: Diagnosis not present

## 2019-03-29 DIAGNOSIS — G7000 Myasthenia gravis without (acute) exacerbation: Secondary | ICD-10-CM | POA: Diagnosis not present

## 2019-03-29 DIAGNOSIS — Z79899 Other long term (current) drug therapy: Secondary | ICD-10-CM | POA: Diagnosis not present

## 2019-03-29 NOTE — Progress Notes (Addendum)
[facility]    Provider:  Dr Jaynee Eagles Referring Provider: Elby Beck, FNP Primary Care Physician:  Elby Beck, FNP  CC:  Myasthenia gravis  Examined patient 04/27/2019 and emailed with patient over the weekend.  Patient with worsening ptosis, ophthalmoplegia, significant diplopia and impairment of vision since decreasing [name] due to low white blood cells, cannot start another immunosuppressant due to his neutropenia,, I have advised patient not to drive, I have increased his steroids unfortunately steroids can take 6 to 12 weeks to work and we cannot keep them for longer than 2 to 3 weeks because we hope you will have his thymus removed and steroids impair healing, need IVIG for myasthenia gravis crisis.  Examined patient's EOM this morning when he came for labs 04/04/2019, impaired right eye adduction. Took a trough level of cellcept today if he is a fast metabolizer and level not therapeutic may consider increasing doseage of cellcept. 04/04/2019.  Virtual Visit via video Note  I connected with Lynnell Chad on 04/02/19 at  4:00 PM EDT by video and verified that I am speaking with the correct person using two identifiers.  Location: Patient: Home Provider: office   I discussed the limitations, risks, security and privacy concerns of performing an evaluation and management service by video and the availability of in person appointments. I also discussed with the patient that there may be a patient responsible charge related to this service. The patient expressed understanding and agreed to proceed.  I discussed the assessment and treatment plan with the patient. The patient was provided an opportunity to ask questions and all were answered. The patient agreed with the plan and demonstrated an understanding of the instructions.   The patient was advised to call back or seek an in-person evaluation if the symptoms worsen or if the condition fails to improve  as anticipated.  I provided 25 minutes of video-face-to-face time during this encounter. Not in person.    [name], MD    Interval history 03/29/2019:  33 year old with Seropositive myasthenia gravis( Ach: Receptor binding antibodies were positive at 0.92). He started [name] 06/2017 100mg  bid and was increased to 1500mg  bid 07/2018. He is on 5mg  of prednisone daily. He feels well except he reports his right eye still does no have full EOM and he cannot fully abduct. He feels he was much better when he was on higher doses of steroids. The cellcept has helped tremendously with his diplopia and ptosis and any generalized symptoms but still reports decreased EOM of the right eye which is bothering him. He does want to get off of the steroids, at this time will decrease to 5mg  every other day but if symptoms worsen will return to 5mg  daily.   Interval history 08/03/2018: He has not been here for over a year and not returned our calls. He has been well. He has experienced more opthalmoplegia when off of the prednisone but at 40mg  with the cellcept he does well. Will increase cellcept. Discussed we really need to decrease his steroids long-term at this dose (or any dose) can cause significant sequelae. Discussed he should be on calcium and vitamin D. Also needs repeat CT chest and eval to cardiothoracic for removal of thymic tissue. Needs labs today and again in 3-4 months. And he needs to be more compliant with his appointments, he has not been here in over a year and has not returned multiple of our calls.  Interval history 05/18/2017: Seropositive  myasthenia gravis. Reviewed MRI of the brain with patient, reviewed images which were unremarkable. CT of the chest did not show thymoma or thymus tumor but discussed that there is evidence that removing any thymic tissue improves outcomes in myasthenia gravis. Patient would like to be seen by cardiothoracic surgeon. Provided literature in myasthenia gravis  and thymectomy. Patient started on 40 mg of steroids. He feels improved, his strength is better, getting more repetitions in the gym, the ptosis is minimally better. The mestinon helps with the diplopia. He only takes a 1/2 pill didn't like a whole pill of mestinon. But he feels improved. Had a long discussion about seropositive myasthenia gravis,   HPI:  EVARISTO TSUDA is a 33 y.o. male here as a referral from Dr. Carlean Purl for myasthenia gravis. Patient reported diplopia and vision changes starting in February. 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 He was instructed to have thyroid tests as well as myasthenia gravis panel checked. 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 Ach: Receptor binding antibodies were positive at 0.92. Back in February he went on a ski trip and towards the end his eyes started feeling weak with trouble focusing. Father had a thyroid issue so he had testing and tsh was normal. He woke up one night nd looked at the clock on the cable box and saw 2 different times. Dr. Manuella Ghazi diagnosed him with thyrotoxicosis and then he was told he had myasthenia gravis. His right eye wanders now. He is having difficulty lifting his arms and working out, he can;t weight lift as well as he used to. No issues swallowing but he has loss of strength. The double vision is worse in the morning and bad all day. Not better after a nap is worse. His eyes are misaligned. He has ptosis. No swallowing difficulty. No voice qualityor breathing difficulties. Feels like it is progressing. Strong family history of autoimmune disease.  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 Symptoms have gotten  worse since then. He went to see an ophthalmologist who diagnosed him with thyrotoxicosis and diffuse goiter, ocular muscle weakness. He was to hold have thyroid tests and myasthenia gravis panel checked. Labs showed thyrotropin receptor autoantibodies, thyroid stimulating immunoglobulin and he was referred to endocrinology. Also reviewed endocrinology diagnosed with Graves' disease.  Ach Receptor binding antibodies were positive.   Review of Systems: Patient complains of symptoms per HPI as well as the following symptoms: No chest pain, no rashes, no shortness of breath. Pertinent negatives and positives per HPI. All others negative.    Social History   Socioeconomic History   Marital status: Single    Spouse name: Not on file   Number of children: 2   Years of education: 16   Highest education level: Not on file  Occupational History   Occupation: [employer] resource strain: Not on file   Food insecurity:    Worry: Not on file    Inability: Not on file   Transportation needs:    Medical: Not on file    Non-medical: Not on file  Tobacco Use   Smoking status: Never Smoker   Smokeless tobacco: Never Used  Substance and Sexual Activity  Alcohol use: Yes    Alcohol/week: 2.0 standard drinks    Types: 2 Shots of liquor per week   Drug use: No   Sexual activity: Yes    Birth control/protection: Condom  Lifestyle   Physical activity:    Days per week: Not on file    Minutes per session: Not on file   Stress: Not on file  Relationships   Social connections:    Talks on phone: Not on file    Gets together: Not on file    Attends religious service: Not on file    Active member of club or organization: Not on file    Attends meetings of clubs or organizations: Not on file    Relationship status: Not on file   Intimate partner violence:    Fear of current or ex partner: Not on file    Emotionally abused: Not on file      Physically abused: Not on file    Forced sexual activity: Not on file  Other Topics Concern   Not on file  Social History Narrative   Lives at home alone   Caffeine: coffee maybe once a week   Right handed    Family History  Problem Relation Age of Onset   Cancer Mother    Thyroid disease Father    Thyroid disease Brother     Past Medical History:  Diagnosis Date   Myasthenia gravis ([place])     Past Surgical History:  Procedure Laterality Date   NO PAST SURGERIES      Current Outpatient Medications  Medication Sig Dispense Refill   mycophenolate ([name]) 500 MG tablet Take 3 tablets (1,500 mg total) by mouth 2 (two) times daily. 540 tablet 3   Nutritional Supplements (PROTEIN SUPPLEMENT 80% PO) Take by mouth.     predniSONE (DELTASONE) 20 MG tablet TAKE 2 TABLETS BY MOUTH ONCE DAILY WITH  BREAKFAST. (Patient taking differently: Take 5 mg by mouth daily. ) 60 tablet 6   timolol (BETIMOL) 0.25 % ophthalmic solution 1-2 drops 2 (two) times daily.     valACYclovir (VALTREX) 1000 MG tablet 1/2 po qd for suppression - for an outbreak increase to 1/2 po bid for 3 days (Patient not taking: Reported on 05/18/2017) 30 tablet 5   No current facility-administered medications for this visit.     Allergies as of 03/29/2019   (No Known Allergies)    Vitals: There were no vitals taken for this visit. Last Weight:  Wt Readings from Last 1 Encounters:  03/22/19 146 lb (66.2 kg)   Last Height:   Ht Readings from Last 1 Encounters:  03/22/19 5\' 8"  (1.727 m)       Neuro: Detailed Neurologic Exam  Speech:    Speech is normal; fluent and spontaneous with normal comprehension.  Cognition:    The patient is oriented to person, place, and time;     recent and remote memory intact;     language fluent;     normal attention, concentration,     fund of knowledge Cranial Nerves:    The pupils are equal, round, and reactive to light. The fundi are normal and  spontaneous venous pulsations are present. Visual fields are full to finger confrontation. Disconjugate gaze, Cannot abduct or addduct laterally or medially, can cross the midline on the right but not on left, fatiguable upgaze, Ptosis left > right, Trigeminal sensation is intact and the muscles of mastication are normal. The face is symmetric. The palate elevates  in the midline. Hearing intact. Voice is normal. Shoulder shrug is normal. The tongue has normal motion without fasciculations.   Coordination:    Normal finger to nose and heel to shin. Normal rapid alternating movements.   Gait:    Heel-toe and tandem gait are normal.   Motor Observation:    No asymmetry, no atrophy, and no involuntary movements noted. Tone:    Normal muscle tone.    Posture:    Posture is normal. normal erect    Strength:    Strength is V/V in the upper and lower limbs.      Sensation: intact to LT     Reflex Exam:  DTR's:    Deep tendon reflexes in the upper and lower extremities are normal bilaterally.   Toes:    The toes are downgoing bilaterally.   Clonus:    Clonus is absent.    Assessment/Plan:  33 year old with seropositive myasthenia gravis, acetylcholine receptor binding antibody positive  At initial exam he had ophthalmoplegia and fatigable extraocular movements with ptosis but last exam normal. Strength is 5 out of 5 however given patient's reports of weakness in the gym I do suspect there may be generalization of myasthenia gravis. Had a long discussion about myasthenia gravis, provided patient with documentation to read as well as reliable online sites for information. Discussed treatments as below, started [name], side effects, risks and lab work needed, risk for bone marrow disease, pancytopenia, hepatotoxicity and other serious sequelae.  Discuss long-term use of steroids and the risks.  Examined patient 04/27/2019 and emailed with patient over the weekend.  Patient with  worsening ptosis, ophthalmoplegia, significant diplopia and impairment of vision since decreasing [name] due to low white blood cells, cannot start another immunosuppressant due to his neutropenia,, I have advised patient not to drive, I have increased his steroids unfortunately steroids can take 6 to 12 weeks to work and we cannot keep them for longer than 2 to 3 weeks because we hope you will have his thymus removed and steroids impair healing, need IVIG for myasthenia gravis crisis.  Addendum 5/62020:  Examined patient's EOM this morning when he came for labs 04/04/2019, impaired right eye adduction. Took a trough level of cellcept today if he is a fast metabolizer and level not therapeutic may consider increasing doseage of cellcept. 04/04/2019. Would have to watch his labs very closely.   - The cellcept has helped tremendously with his diplopia and ptosis and any generalized symptoms but still reports decreased EOM of the right eye which is bothering him. Started in 2018 and increased to 1500mg  bid 07/2018. Patient will come into the office to check a trough level of cellcept.   -  He does want to get off of the steroids, at this time will decrease to 5mg  every other day but if symptoms worsen will return to 5mg  daily.   - Mestinon 3 times a day as needed  - There is literature to show that removal of all thymus tissue improves outcomes in myasthenia gravis. CT of the chest did not show thymoma or thymus malignancy. Cardiothoracic surgery wanted him to repeat CT chest but patient could not afford.  - MRI of the brain was normal  - EMG/NCS for repetitive nerve stimulation: patient declines, at this point given he seropositive with a negative MRI and clinical response to steroids and Mestinon and cellcept I do think that patient has generalized myasthenia gravis and repetitive nerve stimulation is not necessary at this time.  -  cbc, cmp, hgbaic, take vitamin D and calcium  Orders Placed This  Encounter  Procedures   CBC with Differential/Platelets   Comprehensive metabolic panel   Hemoglobin F6C   Mycophenolic Acid ([name])     PRIOR DISCUSSIONS:   We had a discussion about different medication strategies, possible adverse effects and strategies to manage them, expected time course of benefits in medication management which include hours to days for Mestinon, weeks to many months for prednisone, and many months to a year more for Azathioprine. In mild to moderate generalized myasthenia gravis, Mestinon may be the only treatment. A cholinergic crisis, in which weakness is worsened by increased doses of the Mestinon, rarely occurs. The concomitant use of prednisone and sometimes another immunosuppressant is often required. In more severe myasthenia gravis, immunosuppression and sometimes immunomodulation should be started at the same time as Mestinon.  There is evidence that mycophenolate has a more rapid onset of clinical effect. The medicine comes in 250 mg and 500 mg tablets. The standard dose is 1000 mg twice a day. Some people need a higher dose of 1500 mg twice a day. The primary side effect that you might notice is gastrointestinal upset  Discussed all side effects as per patient instructions and above including risk of rare brain disease called PML.  The adverse effects of corticosteroids are numerous, well known, and largely dose-dependent. These include: hypertension, fluid retention, weight gain, potassium loss, hyperlipidemia, diabetes mellitus, osteoporosis, gastric ulceration, cataracts, glaucoma, moon facies, obesity, acne, skin friability, juvenile growth suppression, and mood/personality changes.  Discussed again that should he have acute worsening especially shortness of breath patient should call 911 and be transported to the emergency room due to risks for myasthenia gravis exacerbation and respiratory failure.   Sarina Ill, MD  [facility] [address] [place], [place] [address]  Phone [phone] Fax [phone]

## 2019-04-02 ENCOUNTER — Telehealth: Payer: Self-pay | Admitting: Neurology

## 2019-04-02 ENCOUNTER — Encounter: Payer: Self-pay | Admitting: Neurology

## 2019-04-02 DIAGNOSIS — G7000 Myasthenia gravis without (acute) exacerbation: Secondary | ICD-10-CM | POA: Insufficient documentation

## 2019-04-02 NOTE — Telephone Encounter (Signed)
Eddie [name], would you call and see if patient will come to the office for labs one morning and let me examine him quickly? I'd like him to come one morning before taking his morning dose of cellcept. He states that he feels he is still having symptoms of myasthenia gravis so before I change his meds I want to take a look at his eye movements and get a trough level of [name]. So its really important he comes in the morning before taking his cellcept. The blood test should be 12 hours after the last dose of his cell cept so if he takes it at 8pm in the evening come in for labs at 8am etc. I will take a wuick look at him after he gets labs completed no appointment needed. thanks

## 2019-04-03 NOTE — Telephone Encounter (Signed)
Pt has returned the call to [facility], he is asking for a call back.

## 2019-04-03 NOTE — Telephone Encounter (Signed)
Spoke with the patient. Pt states he usually takes his cellcept around 11 pm however he can take it tonight at 8 PM because he made arrangements to be here at 8 AM (Dr. Jaynee Eagles aware and ok). He understands that he will not be charged for an appointment, the labs will be done and Dr. Jaynee Eagles will check his eyes. He understands not to take the morning dose of cellcept until after he has labs drawn. Pt verbalized appreciation.   Placed pt on lab schedule for 5/6 @ 8 AM.

## 2019-04-03 NOTE — Telephone Encounter (Signed)
I called the pt & LVM (ok per DPR) asking for call back to discuss pt coming in briefly one morning even tomorrow morning for 8:00 labs and a quick check by Dr. Jaynee Eagles BEFORE he takes his morning dose of cellcept. I left office number in message.

## 2019-04-04 ENCOUNTER — Other Ambulatory Visit (INDEPENDENT_AMBULATORY_CARE_PROVIDER_SITE_OTHER): Payer: Self-pay

## 2019-04-04 ENCOUNTER — Other Ambulatory Visit: Payer: Self-pay

## 2019-04-04 DIAGNOSIS — Z79899 Other long term (current) drug therapy: Secondary | ICD-10-CM

## 2019-04-04 DIAGNOSIS — Z0289 Encounter for other administrative examinations: Secondary | ICD-10-CM

## 2019-04-04 DIAGNOSIS — G7000 Myasthenia gravis without (acute) exacerbation: Secondary | ICD-10-CM

## 2019-04-06 LAB — COMPREHENSIVE METABOLIC PANEL
ALT: 18 IU/L (ref 0–44)
AST: 19 IU/L (ref 0–40)
Albumin/Globulin Ratio: 1.9 (ref 1.2–2.2)
Albumin: 4.4 g/dL (ref 4.0–5.0)
Alkaline Phosphatase: 46 IU/L (ref 39–117)
BUN/Creatinine Ratio: 15 (ref 9–20)
BUN: 18 mg/dL (ref 6–20)
Bilirubin Total: 0.4 mg/dL (ref 0.0–1.2)
CO2: 25 mmol/L (ref 20–29)
Calcium: 9.5 mg/dL (ref 8.7–10.2)
Chloride: 102 mmol/L (ref 96–106)
Creatinine, Ser: 1.17 mg/dL (ref 0.76–1.27)
GFR calc Af Amer: 93 mL/min/{1.73_m2} (ref >59)
GFR calc non Af Amer: 81 mL/min/{1.73_m2} (ref >59)
Globulin, Total: 2.3 g/dL (ref 1.5–4.5)
Glucose: 123 mg/dL — ABNORMAL HIGH (ref 65–99)
Potassium: 4.3 mmol/L (ref 3.5–5.2)
Sodium: 139 mmol/L (ref 134–144)
Total Protein: 6.7 g/dL (ref 6.0–8.5)

## 2019-04-06 LAB — CBC WITH DIFFERENTIAL/PLATELET
Basophils Absolute: 0 10*3/uL (ref 0.0–0.2)
Basos: 1 %
EOS (ABSOLUTE): 0.1 10*3/uL (ref 0.0–0.4)
Eos: 4 %
Hematocrit: 43.9 % (ref 37.5–51.0)
Hemoglobin: 14.4 g/dL (ref 13.0–17.7)
Immature Grans (Abs): 0 10*3/uL (ref 0.0–0.1)
Immature Granulocytes: 0 %
Lymphocytes Absolute: 1.1 10*3/uL (ref 0.7–3.1)
Lymphs: 41 %
MCH: 28.2 pg (ref 26.6–33.0)
MCHC: 32.8 g/dL (ref 31.5–35.7)
MCV: 86 fL (ref 79–97)
Monocytes Absolute: 0.4 10*3/uL (ref 0.1–0.9)
Monocytes: 14 %
Neutrophils Absolute: 1.1 10*3/uL — ABNORMAL LOW (ref 1.4–7.0)
Neutrophils: 40 %
Platelets: 145 10*3/uL — ABNORMAL LOW (ref 150–450)
RBC: 5.11 x10E6/uL (ref 4.14–5.80)
RDW: 12.1 % (ref 11.6–15.4)
WBC: 2.6 10*3/uL — ABNORMAL LOW (ref 3.4–10.8)

## 2019-04-06 LAB — MYCOPHENOLIC ACID (CELLCEPT)
MPA Glucuronide: 79 ug/mL (ref 15–125)
MPA: 7.4 ug/mL (ref 1.0–3.5)

## 2019-04-06 LAB — HEMOGLOBIN A1C
Est. average glucose Bld gHb Est-mCnc: 120 mg/dL
Hgb A1c MFr Bld: 5.8 % — ABNORMAL HIGH (ref 4.8–5.6)

## 2019-04-19 ENCOUNTER — Other Ambulatory Visit (INDEPENDENT_AMBULATORY_CARE_PROVIDER_SITE_OTHER): Payer: Self-pay

## 2019-04-19 ENCOUNTER — Other Ambulatory Visit: Payer: Self-pay | Admitting: Neurology

## 2019-04-19 ENCOUNTER — Other Ambulatory Visit: Payer: Self-pay

## 2019-04-19 DIAGNOSIS — Z0289 Encounter for other administrative examinations: Secondary | ICD-10-CM

## 2019-04-19 DIAGNOSIS — Z79899 Other long term (current) drug therapy: Secondary | ICD-10-CM

## 2019-04-20 LAB — COMPREHENSIVE METABOLIC PANEL
ALT: 21 IU/L (ref 0–44)
AST: 21 IU/L (ref 0–40)
Albumin/Globulin Ratio: 2.1 (ref 1.2–2.2)
Albumin: 4.7 g/dL (ref 4.0–5.0)
Alkaline Phosphatase: 39 IU/L (ref 39–117)
BUN/Creatinine Ratio: 14 (ref 9–20)
BUN: 15 mg/dL (ref 6–20)
Bilirubin Total: 0.4 mg/dL (ref 0.0–1.2)
CO2: 24 mmol/L (ref 20–29)
Calcium: 9.6 mg/dL (ref 8.7–10.2)
Chloride: 102 mmol/L (ref 96–106)
Creatinine, Ser: 1.11 mg/dL (ref 0.76–1.27)
GFR calc Af Amer: 100 mL/min/{1.73_m2} (ref >59)
GFR calc non Af Amer: 86 mL/min/{1.73_m2} (ref >59)
Globulin, Total: 2.2 g/dL (ref 1.5–4.5)
Glucose: 79 mg/dL (ref 65–99)
Potassium: 4.1 mmol/L (ref 3.5–5.2)
Sodium: 142 mmol/L (ref 134–144)
Total Protein: 6.9 g/dL (ref 6.0–8.5)

## 2019-04-20 LAB — CBC WITH DIFFERENTIAL/PLATELET
Basophils Absolute: 0.1 10*3/uL (ref 0.0–0.2)
Basos: 2 %
EOS (ABSOLUTE): 0.1 10*3/uL (ref 0.0–0.4)
Eos: 4 %
Hematocrit: 43.4 % (ref 37.5–51.0)
Hemoglobin: 14.1 g/dL (ref 13.0–17.7)
Immature Grans (Abs): 0 10*3/uL (ref 0.0–0.1)
Immature Granulocytes: 0 %
Lymphocytes Absolute: 1.4 10*3/uL (ref 0.7–3.1)
Lymphs: 43 %
MCH: 28 pg (ref 26.6–33.0)
MCHC: 32.5 g/dL (ref 31.5–35.7)
MCV: 86 fL (ref 79–97)
Monocytes Absolute: 0.3 10*3/uL (ref 0.1–0.9)
Monocytes: 10 %
Neutrophils Absolute: 1.3 10*3/uL — ABNORMAL LOW (ref 1.4–7.0)
Neutrophils: 41 %
Platelets: 134 10*3/uL — ABNORMAL LOW (ref 150–450)
RBC: 5.03 x10E6/uL (ref 4.14–5.80)
RDW: 12.4 % (ref 11.6–15.4)
WBC: 3.2 10*3/uL — ABNORMAL LOW (ref 3.4–10.8)

## 2019-04-24 ENCOUNTER — Other Ambulatory Visit: Payer: Self-pay | Admitting: Neurology

## 2019-04-24 DIAGNOSIS — Q892 Congenital malformations of other endocrine glands: Secondary | ICD-10-CM

## 2019-04-24 DIAGNOSIS — G7000 Myasthenia gravis without (acute) exacerbation: Secondary | ICD-10-CM

## 2019-04-24 DIAGNOSIS — D4989 Neoplasm of unspecified behavior of other specified sites: Secondary | ICD-10-CM

## 2019-04-26 ENCOUNTER — Telehealth: Payer: Self-pay | Admitting: Neurology

## 2019-04-26 NOTE — Telephone Encounter (Signed)
[employer] Auth: [id number] (exp. 04/26/19 to 10/22/19) order sent to GI. They will reach out to the patient to schedule.

## 2019-05-01 ENCOUNTER — Encounter: Payer: Self-pay | Admitting: *Deleted

## 2019-05-01 ENCOUNTER — Telehealth: Payer: Self-pay | Admitting: *Deleted

## 2019-05-01 DIAGNOSIS — G7001 Myasthenia gravis with (acute) exacerbation: Secondary | ICD-10-CM | POA: Insufficient documentation

## 2019-05-01 NOTE — Telephone Encounter (Signed)
Per Dr. Jaynee Eagles, start IVIG withloading dose of 2 g/kg over 3 days via IV followed by 1 g/kg over 1-2 days as tolerated by patient every 4 weeks via IV. Pre-treatment oftylenol PO 650 mg x1 as well as Benadryl (can be PO or IV or IM) 50 mgx 1. Orders written and signed by Dr. Jaynee Eagles also included office notes, labs, patient demo/insurance, previous CT chest results and MRI brain results. Faxed all information to [employer] [facility] [place]. Received a receipt of confirmation.

## 2019-05-03 NOTE — Telephone Encounter (Signed)
Tiffany called to clarify if pt had received EMG. I advised the EMG ordered in June 2018 was not completed here and the latest note stated EMG was not necessary at this time given pt's other testing and response to medications. She verbalized appreciation. They will submit the authorization as urgent.

## 2019-05-08 NOTE — Telephone Encounter (Signed)
Late entry from 05/07/2019. Orders from [facility] reviewed and signed by Dr. Jaynee Eagles. Faxed back to [facility]. Received a receipt of confirmation.  1a. IVIG 130 grams total IV dose administered in divided doses over 3 days by RN. Infusion rates determination by pharmacist. Nurse to titrate rate as tolerated by patient. One time loading dose.  1b. IVIG 70 grams total IV dose administered in divided doses over 1-2 days by RN. Infusion rates determined by pharmacist. Nurse to titrate rate as tolerated by patient.   Premedication:  Acetaminophen 325 mg: Take 3 tablets by mouth 30-60 minutes prior to infusion. Benadryl 25 mg tablet or capsule: Take 2 by mouth 30-60 minutes prior to infusion.   Hydration orders. Anaphylaxis kit orders. Nursing orders.

## 2019-05-14 NOTE — Telephone Encounter (Addendum)
Received approval of IVIG from [employer]. Approved for 13 visits from 05/08/2019-05/07/2020. Sent approval notice to medical records for scanning.

## 2019-05-15 ENCOUNTER — Other Ambulatory Visit: Payer: Self-pay | Admitting: Neurology

## 2019-05-15 ENCOUNTER — Other Ambulatory Visit: Payer: Self-pay

## 2019-05-15 ENCOUNTER — Ambulatory Visit
Admission: RE | Admit: 2019-05-15 | Discharge: 2019-05-15 | Disposition: A | Discharge status: Home / Self Care | Payer: BC Managed Care – PPO | Source: Ambulatory Visit | Attending: Neurology | Admitting: Neurology

## 2019-05-15 DIAGNOSIS — Q892 Congenital malformations of other endocrine glands: Secondary | ICD-10-CM

## 2019-05-15 DIAGNOSIS — D4989 Neoplasm of unspecified behavior of other specified sites: Secondary | ICD-10-CM

## 2019-05-15 DIAGNOSIS — G7000 Myasthenia gravis without (acute) exacerbation: Secondary | ICD-10-CM

## 2019-05-15 MED ORDER — IOPAMIDOL (ISOVUE-300) INJECTION 61%
75.0000 mL | Freq: Once | INTRAVENOUS | Status: AC | PRN
  Administered 2019-05-15: 75 mL via INTRAVENOUS

## 2019-05-30 NOTE — Telephone Encounter (Signed)
Received IVIG orders from [facility]. Orders signed by Dr. Jaynee Eagles and faxed back to [facility]. Received a receipt of confirmation.  Using brand Panzyga.

## 2019-06-07 ENCOUNTER — Encounter: Payer: Self-pay | Admitting: Cardiothoracic Surgery

## 2019-06-07 ENCOUNTER — Other Ambulatory Visit: Payer: Self-pay

## 2019-06-07 ENCOUNTER — Institutional Professional Consult (permissible substitution) (INDEPENDENT_AMBULATORY_CARE_PROVIDER_SITE_OTHER): Payer: BC Managed Care – PPO | Admitting: Cardiothoracic Surgery

## 2019-06-07 VITALS — BP 134/80 | HR 63 | Temp 97.9°F | Resp 20 | Ht 68.0 in | Wt 146.0 lb

## 2019-06-07 DIAGNOSIS — G7000 Myasthenia gravis without (acute) exacerbation: Secondary | ICD-10-CM

## 2019-06-07 NOTE — Progress Notes (Signed)
PCP is Elby Beck, FNP Referring Provider is [name], MD  Chief Complaint  Eddie [name] presents with  . Consult    Surgical eval for possible thymectomy Chest CT 05/15/19  Eddie [name] examined, images of recent CT scan of chest personally reviewed and counseled with Eddie [name].  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  He is followed by Dr. Lavell Anchors.  He is currently on [name], prednisone 30 mg and last week he had IVIG in preparation for surgery.  He does not take Mestinon.  He has no symptoms of dyspnea, swallowing problems, or falls.  His symptoms mainly involve ptosis of the left eye and double vision which currently are fairly well-controlled.  The Eddie [name] has had no major previous surgery.  He denies previous thoracic trauma.  He is right-hand dominant and denies bleeding disorder or previous blood transfusion.  He has no smoking history.  No history of cardiac disease murmur or arrhythmia.  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   Past Medical History:  Diagnosis Date  . Myasthenia gravis [facility])     Past Surgical History:  Procedure Laterality Date  . NO PAST SURGERIES      Family History  Problem Relation Age of Onset  . Cancer Mother   . Thyroid disease Father   . Thyroid disease Brother     Social History Social History   Tobacco Use  . Smoking status: Never Smoker  . Smokeless tobacco: Never Used  Substance Use Topics  . Alcohol use: Yes    Alcohol/week: 2.0 standard drinks    Types: 2 Shots of liquor per week  . Drug use: No    Current Outpatient Medications  Medication Sig Dispense Refill  . mycophenolate ([name]) 500 MG tablet Take 3 tablets (1,500 mg total) by mouth 2 (two) times daily. 540 tablet 3  . Nutritional Supplements (PROTEIN SUPPLEMENT 80% PO) Take by mouth.    . predniSONE (DELTASONE) 20 MG  tablet TAKE 2 TABLETS BY MOUTH ONCE DAILY WITH  BREAKFAST. (Eddie [name] taking differently: Take 5 mg by mouth daily. ) 60 tablet 6  . timolol (BETIMOL) 0.25 % ophthalmic solution 1-2 drops 2 (two) times daily.    . valACYclovir (VALTREX) 1000 MG tablet 1/2 po qd for suppression - for an outbreak increase to 1/2 po bid for 3 days (Eddie [name] not taking: Reported on 05/18/2017) 30 tablet 5   No current facility-administered medications for this visit.     No Known Allergies  Review of Systems                    Review of Systems :  [ y ] = yes, [  ] = no        General :  Weight gain [   ]    Weight loss  [   ]  Fatigue [  ]  Fever [  ]  Chills  [  ]                                          HEENT    Headache [  ]  Dizziness [  ]  Blurred vision Blue.Reese  ] Glaucoma  [  ]  Nosebleeds [  ] Painful or loose teeth [  ]        Cardiac :  Chest pain/ pressure [  ]  Resting SOB [  ] exertional SOB [  ]                        Orthopnea [  ]  Pedal edema  [  ]  Palpitations [  ] Syncope/presyncope [ ]                         Paroxysmal nocturnal dyspnea [  ]         Pulmonary : cough [  ]  wheezing [  ]  Hemoptysis [  ] Sputum [  ] Snoring [  ]                              Pneumothorax [  ]  Sleep apnea [  ]        GI : Vomiting [  ]  Dysphagia [  ]  Melena  [  ]  Abdominal pain [  ] BRBPR [  ]              Heart burn [  ]  Constipation [  ] Diarrhea  [  ] Colonoscopy [   ]        GU : Hematuria [  ]  Dysuria [  ]  Nocturia [  ] UTI's [  ]        Vascular : Claudication [  ]  Rest pain [  ]  DVT [  ] Vein stripping [  ] leg ulcers [  ]                          TIA [  ] Stroke [  ]  Varicose veins [  ]        NEURO :  Headaches  [  ] Seizures [  ] Vision changes [ y ] Paresthesias [  ]          brain MRI  normal                                     Musculoskeletal :  Arthritis [  ] Gout  [  ]  Back pain [  ]  Joint pain [  ]        Skin :  Rash [  ]  Melanoma [  ] Sores [  ]         Heme : Bleeding problems [  ]Clotting Disorders [  ] Anemia [  ]Blood Transfusion [ ]         Endocrine : Diabetes [  ] Heat or Cold intolerance [  ] Polyuria [  ]excessive thirst [ ]         Psych : Depression [  ]  Anxiety [  ]  Psych hospitalizations [  ] Memory change [  ]  BP 134/80   Pulse 63   Temp 97.9 F (36.6 C) (Skin)   Resp 20   Ht 5\' 8"  (1.727 m)   Wt 146 lb (66.2 kg)   SpO2 98% Comment: RA  BMI 22.20 kg/m  Physical Exam      Physical Exam  General: Well-nourished middle-aged male no acute distress HEENT: Normocephalic pupils equal , mild left ptosis, dentition adequate Neck: Supple without JVD, adenopathy, or bruit Chest: Clear to auscultation, symmetrical breath sounds, no rhonchi, no tenderness             or deformity Cardiovascular: Regular rate and rhythm, no murmur, no gallop, peripheral pulses             palpable in all extremities Abdomen:  Soft, nontender, no palpable mass or organomegaly Extremities: Warm, well-perfused, no clubbing cyanosis edema or tenderness,              no venous stasis changes of the legs Rectal/GU: Deferred Neuro: Grossly non--focal and symmetrical throughout, good general strength, good bedside pulmonary mechanics Skin: Clean and dry without rash or ulceration    Diagnostic Tests: CT scan of chest shows 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  No thymoma noted.  Impression: With resection of residual thymic tissue the Eddie [name] should show  clinical improvement and hopefully be able to wean or eliminate long-term immunosuppressive drugs. Eddie [name] will need partial sternotomy and approximately 4 to 5-day hospital stay.  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 I discussed the details of surgery, use  of general anesthesia, the expected postoperative recovery, and the potential risks of bleeding, pain, wound infection, pulmonary problems including pleural effusion. Plan: Eddie [name] will be scheduled for partial sternotomy /thymectomy on July 14 at [place], MD [facility] [phone]

## 2019-06-08 ENCOUNTER — Telehealth: Payer: Self-pay

## 2019-06-08 ENCOUNTER — Encounter (HOSPITAL_COMMUNITY)
Admission: RE | Admit: 2019-06-08 | Discharge: 2019-06-08 | Disposition: A | Discharge status: Home / Self Care | Payer: BC Managed Care – PPO | Source: Ambulatory Visit | Attending: Cardiothoracic Surgery | Admitting: Cardiothoracic Surgery

## 2019-06-08 ENCOUNTER — Other Ambulatory Visit (HOSPITAL_COMMUNITY)
Admission: RE | Admit: 2019-06-08 | Discharge: 2019-06-08 | Disposition: A | Discharge status: Home / Self Care | Payer: BC Managed Care – PPO | Source: Ambulatory Visit | Attending: Cardiothoracic Surgery | Admitting: Cardiothoracic Surgery

## 2019-06-08 ENCOUNTER — Ambulatory Visit (HOSPITAL_COMMUNITY)
Admission: RE | Admit: 2019-06-08 | Discharge: 2019-06-08 | Disposition: A | Discharge status: Home / Self Care | Payer: BC Managed Care – PPO | Source: Ambulatory Visit | Attending: Cardiothoracic Surgery | Admitting: Cardiothoracic Surgery

## 2019-06-08 ENCOUNTER — Encounter (HOSPITAL_COMMUNITY): Payer: Self-pay

## 2019-06-08 ENCOUNTER — Other Ambulatory Visit: Payer: Self-pay

## 2019-06-08 DIAGNOSIS — G7000 Myasthenia gravis without (acute) exacerbation: Secondary | ICD-10-CM

## 2019-06-08 DIAGNOSIS — Z1159 Encounter for screening for other viral diseases: Secondary | ICD-10-CM | POA: Diagnosis not present

## 2019-06-08 DIAGNOSIS — Z01818 Encounter for other preprocedural examination: Secondary | ICD-10-CM | POA: Diagnosis not present

## 2019-06-08 LAB — COMPREHENSIVE METABOLIC PANEL
ALT: 31 U/L (ref 0–44)
AST: 23 U/L (ref 15–41)
Albumin: 3.7 g/dL (ref 3.5–5.0)
Alkaline Phosphatase: 28 U/L — ABNORMAL LOW (ref 38–126)
Anion gap: 11 (ref 5–15)
BUN: 20 mg/dL (ref 6–20)
CO2: 21 mmol/L — ABNORMAL LOW (ref 22–32)
Calcium: 9.5 mg/dL (ref 8.9–10.3)
Chloride: 106 mmol/L (ref 98–111)
Creatinine, Ser: 1.16 mg/dL (ref 0.61–1.24)
GFR calc Af Amer: >60 mL/min (ref >60)
GFR calc non Af Amer: >60 mL/min (ref >60)
Glucose, Bld: 98 mg/dL (ref 70–99)
Potassium: 3.4 mmol/L — ABNORMAL LOW (ref 3.5–5.1)
Sodium: 138 mmol/L (ref 135–145)
Total Bilirubin: 0.6 mg/dL (ref 0.3–1.2)
Total Protein: 7.6 g/dL (ref 6.5–8.1)

## 2019-06-08 LAB — ABO/RH: ABO/RH(D): O POS

## 2019-06-08 LAB — CBC
HCT: 44.4 % (ref 39.0–52.0)
Hemoglobin: 14 g/dL (ref 13.0–17.0)
MCH: 28.3 pg (ref 26.0–34.0)
MCHC: 31.5 g/dL (ref 30.0–36.0)
MCV: 89.9 fL (ref 80.0–100.0)
Platelets: 151 10*3/uL (ref 150–400)
RBC: 4.94 MIL/uL (ref 4.22–5.81)
RDW: 13.2 % (ref 11.5–15.5)
WBC: 6 10*3/uL (ref 4.0–10.5)
nRBC: 0 % (ref 0.0–0.2)

## 2019-06-08 LAB — BLOOD GAS, ARTERIAL
Acid-Base Excess: 2.6 mmol/L — ABNORMAL HIGH (ref 0.0–2.0)
Bicarbonate: 26.6 mmol/L (ref 20.0–28.0)
Drawn by: 470591
FIO2: 21
O2 Saturation: 99.2 %
Patient temperature: 98.6
pCO2 arterial: 41 mmHg (ref 32.0–48.0)
pH, Arterial: 7.429 (ref 7.350–7.450)
pO2, Arterial: 163 mmHg — ABNORMAL HIGH (ref 83.0–108.0)

## 2019-06-08 LAB — URINALYSIS, ROUTINE W REFLEX MICROSCOPIC
Bacteria, UA: NONE SEEN
Bilirubin Urine: NEGATIVE
Glucose, UA: NEGATIVE mg/dL
Hgb urine dipstick: NEGATIVE
Ketones, ur: NEGATIVE mg/dL
Nitrite: NEGATIVE
Protein, ur: NEGATIVE mg/dL
Specific Gravity, Urine: 1.03 (ref 1.005–1.030)
pH: 6 (ref 5.0–8.0)

## 2019-06-08 LAB — SURGICAL PCR SCREEN
MRSA, PCR: NEGATIVE
Staphylococcus aureus: NEGATIVE

## 2019-06-08 LAB — SARS CORONAVIRUS 2 (TAT 6-24 HRS): SARS Coronavirus 2: NEGATIVE

## 2019-06-08 LAB — PROTIME-INR
INR: 1.2 (ref 0.8–1.2)
Prothrombin Time: 15.3 seconds — ABNORMAL HIGH (ref 11.4–15.2)

## 2019-06-08 LAB — TYPE AND SCREEN
ABO/RH(D): O POS
Antibody Screen: NEGATIVE

## 2019-06-08 LAB — APTT: aPTT: 27 seconds (ref 24–36)

## 2019-06-08 NOTE — Progress Notes (Signed)
Anesthesia Chart Review:  Case: [id number] Date/Time: 06/12/19 0715   Procedures:      STERNOTOMY (N/A )     THYMECTOMY (N/A )   Anesthesia type: General   Pre-op diagnosis: Myasthenia gravis   Location: [facility] OR ROOM 17 / [place] OR   Surgeon: Ivin Poot, MD      DISCUSSION: Patient is a 33 year old male scheduled for the above procedure.  He was diagnosed with Myasthenia gravis (MG) in 2018. Currently he is on [name], prednisone, and received IVIG last week in preparation for surgery. He was seen by Dr. Prescott Gum on 06/07/19. Negative cardiopulmonary ROS. He denies swallowing difficulties or falls. Primary symptoms involve ptosis of the left eye and double vision "which currently are fairly well-controlled." He is a never smoker. 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  By notes his Total T4 was initially elevated, but normalized and his Thyrotropin Receptor Ab test and thyroid stimulating immunoglobulin were WNL, Brain MRI WNL. However, his acetylcholine receptor binding antibody was positive 03/2017, prompting referral to neurology for MG. He has been followed by Dr. Jaynee Eagles. May 2020 notes indicate patient developed worsening ptosis, ophthalmoplegia and diplopia since decreasing [name] due to low white counts, and she could not start him on another immunosuppressive due to neutropenia. She increased his steroids and ordered IVIG and referral to CT surgery.  Preoperative EKG showed LVH with possible repolarization pattern. No comparison tracings. BP 129/95. No chest pain or SOB at PAT RN interview and negative cardiopulmonary ROS when he saw Dr. Prescott Gum on 06/07/19. Reportedly had been working out in the recent past. EKG forwarded to Dr. Prescott Gum for review, but in the absence of symptoms it is anticipated that he can proceed as planned from an anesthesia standpoint; however plan to repeat EKG on the day of surgery per my discussion with anesthesiologist Renold Don, MD.   06/08/2019 presurgical COVID test is in process.   VS: BP (!) 129/95   Pulse 73   Resp 20   Ht 5\' 8"  (1.727 m)   Wt 67.4 kg   SpO2 97%   BMI 22.58 kg/m    BP Readings from Last 3 Encounters:  06/08/19 (!) 129/95  06/07/19 134/80  08/03/18 118/81    PROVIDERS: Elby Beck, FNP is PCP [facility]) Sarina Ill, MD is neurologist    LABS: Labs reviewed: Acceptable for surgery. (all labs ordered are listed, but only abnormal results are displayed)  Labs Reviewed  BLOOD GAS, ARTERIAL - Abnormal; Notable for the following components:      Result Value   pO2, Arterial 163 (*)    Acid-Base Excess 2.6 (*)    All other components within normal limits  COMPREHENSIVE METABOLIC PANEL - Abnormal; Notable for the following components:   Potassium 3.4 (*)    CO2 21 (*)    Alkaline Phosphatase 28 (*)    All other components within normal limits  PROTIME-INR - Abnormal; Notable for the following components:   Prothrombin Time 15.3 (*)    All other components within normal limits  URINALYSIS, ROUTINE W REFLEX MICROSCOPIC - Abnormal; Notable for the following components:   Leukocytes,Ua TRACE (*)    All other components within normal limits  SURGICAL PCR SCREEN  SARS CORONAVIRUS 2 (PERFORMED IN [facility] LAB)  APTT  CBC  TYPE AND SCREEN  ABO/RH    IMAGES: CXR 06/08/19: IMPRESSION: No active cardiopulmonary disease.  CT chest 05/15/19: FINDINGS: - Cardiovascular: No significant vascular findings. 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 Normal heart size. No pericardial effusion. -  Mediastinum/Nodes: No enlarged mediastinal, hilar, or axillary lymph nodes. There is unchanged soft tissue in the anterior mediastinum without evidence of discrete mass. Thyroid gland, trachea, and esophagus demonstrate no significant findings. - Lungs/Pleura: Lungs are clear. No pleural effusion or pneumothorax. - Upper Abdomen: No acute abnormality. Benign hemangioma with peripheral nodular enhancement of the central right lobe of the liver. - Musculoskeletal: No chest wall mass or suspicious bone lesions identified. IMPRESSION: 1. There is unchanged soft tissue in the anterior mediastinum, consistent with thymic tissue, without evidence of discrete mass. 2. 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  Brain MRI 05/10/17: IMPRESSION:  Normal MRI brain (with and without).   EKG: 06/08/19: Sinus bradycardia at 59 bpm Left ventricular hypertrophy ST elevation, consider early repolarization, pericarditis, or injury Abnormal ECG Confirmed by Dixie Dials ([id number]) on 06/08/2019 3:35:41 PM - No comparison tracing in [facility] or Muse   CV: N/A   Past Medical History:  Diagnosis Date  . Myasthenia gravis [facility])     Past Surgical History:  Procedure Laterality Date  . eyelid surgery Right    as a teenager    MEDICATIONS: . mycophenolate ([name]) 500 MG tablet  . Nutritional Supplements (PROTEIN SUPPLEMENT 80% PO)  . predniSONE (DELTASONE) 20 MG tablet  . timolol (BETIMOL) 0.25 % ophthalmic solution   No current facility-administered medications for this encounter.     Myra Gianotti, PA-C Surgical Short Stay/Anesthesiology [facility] Phone [phone] [facility] Phone [phone] 06/08/2019 5:11 PM

## 2019-06-08 NOTE — Progress Notes (Signed)
Patient informed of the [place] that is currently in effect.  Patient verbalized understanding.  Patient denies shortness of breath, fever, cough and chest pain at PAT appointment.  PCP - Clarene Reamer, FNP Neurology - Dr Sarina Ill  Chest x-ray - 06/08/19 EKG - 06/08/19 Stress Test - Denies ECHO - Denies Cardiac Cath - Denies  Coronavirus Screening Have you or a family member experienced the following symptoms:  Cough yes/no: No Fever (>100.73F)  yes/no: No Runny nose yes/no: No Sore throat yes/no: No Difficulty breathing/shortness of breath  yes/no: No  Have you or a family member traveled in the last 14 days and where? yes/no: No

## 2019-06-08 NOTE — Telephone Encounter (Signed)
Per Dr. Prescott Gum patient was advised his labs were normal.  Patient advised that he should start taking 20mg  of prednisone once daily starting tomorrow.  Patient acknowledged receipt.

## 2019-06-08 NOTE — Progress Notes (Signed)
Patient scheduled for Covid screening today after PAT appointment 06/08/19.

## 2019-06-08 NOTE — Progress Notes (Signed)
[place] ([place]), [place] - [address] [place] ([place] [address] Phone: [phone] Fax: [phone]  [facility] [place] [place] [facility] [address] [place] [address] Phone: [phone] Fax: [phone]  [facility] [place] [place] [address] [place] [address] Phone: [phone] Fax: [phone]  [facility] [place] [place] [facility] [place] [address] [place] [address] Phone: [phone] Fax: [phone]      Your procedure is scheduled on Tuesday, 06/12/19.  Report to [facility] Main Entrance "A" at 5:30 A.M., and check in at the Admitting office.  Call this number if you have problems the morning of surgery:  [phone]  Call [phone] if you have any questions prior to your surgery date Monday-Friday 8am-4pm    Remember:  Do not eat or drink after midnight the night before your surgery (Mon)  Take these medicines the morning of surgery with A SIP OF WATER :  mycophenolate ([name])  predniSONE (DELTASONE) timolol (BETIMOL) eye drops  STOP now taking any Aspirin (unless otherwise instructed by your surgeon), Aleve, Naproxen, Ibuprofen, Motrin, Advil, Goody's, BC's, all herbal medications, fish oil, and all vitamins.    The Morning of Surgery  Do not wear jewelry.  Do not wear lotions, powders, or colognes, or deodorant  Do not shave 48 hours prior to surgery.  Men may shave face and neck.  Do not bring valuables to the hospital.  [facility] is not responsible for any belongings or valuables.  If you are a smoker, DO NOT Smoke 24 hours prior to surgery  IF you wear a CPAP at night please bring your mask, tubing, and machine the morning of surgery   Remember that you must have someone to transport you home after your surgery, and remain with  you for 24 hours if you are discharged the same day.  Patients discharged the day of surgery will not be allowed to drive home.  Contacts, glasses, hearing aids, dentures or bridgework may not be worn into surgery.   For patients admitted to the hospital, discharge time will be determined by your treatment team.  Special instructions:   - Preparing For Surgery  Before surgery, you can play an important role. Because skin is not sterile, your skin needs to be as free of germs as possible. You can reduce the number of germs on your skin by washing with CHG (chlorahexidine gluconate) Soap before surgery.  CHG is an antiseptic cleaner which kills germs and bonds with the skin to continue killing germs even after washing.    Oral Hygiene is also important to reduce your risk of infection.  Remember - BRUSH YOUR TEETH THE MORNING OF SURGERY WITH YOUR REGULAR TOOTHPASTE  Please do not use if you have an allergy to CHG or antibacterial soaps. If your skin becomes reddened/irritated stop using the CHG.  Do not shave (including legs and underarms) for at least 48 hours prior to first CHG shower. It is OK to shave your face.  Please follow these instructions carefully.   1. Shower the NIGHT BEFORE SURGERY Monday and the MORNING OF SURGERY Tuesday with CHG Soap.   2. If you chose to wash your hair, wash your hair first as usual with your normal shampoo.  3. After you shampoo, rinse your hair and body thoroughly to remove the  shampoo.  4. Use CHG as you would any other liquid soap. You can apply CHG directly to the skin and wash gently with a scrungie or a clean washcloth.   5. Apply the CHG Soap to your body ONLY FROM THE NECK DOWN.  Do not use on open wounds or open sores. Avoid contact with your eyes, ears, mouth and genitals (private parts). Wash Face and genitals (private parts)  with your normal soap.   6. Wash thoroughly, paying special attention to the area where your surgery will  be performed.  7. Thoroughly rinse your body with warm water from the neck down.  8. DO NOT shower/wash with your normal soap after using and rinsing off the CHG Soap.  9. Pat yourself dry with a CLEAN TOWEL.  10. Wear CLEAN PAJAMAS to bed the night before surgery, wear comfortable clothes the morning of surgery  11. Place CLEAN SHEETS on your bed the night of your first shower and DO NOT SLEEP WITH PETS.   Day of Surgery:  Do not apply any deodorants/lotions. Please shower the morning of surgery with the CHG soap  Please wear clean clothes to the hospital/surgery center.   Remember to brush your teeth WITH YOUR REGULAR TOOTHPASTE.   Please read over the following fact sheets that you were given.

## 2019-06-08 NOTE — Progress Notes (Signed)
Sent to [place], PA/James, PA to review EKG.

## 2019-06-08 NOTE — Anesthesia Preprocedure Evaluation (Addendum)
Anesthesia Evaluation  Patient identified by MRN, date of birth, ID band Patient awake    Reviewed: Allergy & Precautions, NPO status , Patient's Chart, lab work & pertinent test results  Airway Mallampati: II  TM Distance: >3 FB Neck ROM: Full    Dental  (+) Teeth Intact, Dental Advisory Given   Pulmonary    breath sounds clear to auscultation       Cardiovascular negative cardio ROS   Rhythm:Regular Rate:Bradycardia     Neuro/Psych  Neuromuscular disease    GI/Hepatic Neg liver ROS,   Endo/Other  negative endocrine ROS  Renal/GU negative Renal ROS     Musculoskeletal   Abdominal Normal abdominal exam  (+)   Peds  Hematology   Anesthesia Other Findings   Reproductive/Obstetrics                           Anesthesia Physical Anesthesia Plan  ASA: II  Anesthesia Plan: General   Post-op Pain Management:    Induction: Intravenous  PONV Risk Score and Plan: 3 and Ondansetron, Dexamethasone and Midazolam  Airway Management Planned: Oral ETT  Additional Equipment: Arterial line, CVP and Ultrasound Guidance Line Placement  Intra-op Plan:   Post-operative Plan: Possible Post-op intubation/ventilation  Informed Consent: I have reviewed the patients History and Physical, chart, labs and discussed the procedure including the risks, benefits and alternatives for the proposed anesthesia with the patient or authorized representative who has indicated his/her understanding and acceptance.     Dental advisory given  Plan Discussed with: CRNA  Anesthesia Plan Comments: (PAT note written 06/08/2019 by Myra Gianotti, PA-C. For repeat EKG on the day of surgery. History of MG (on [name], prednisone, s/p IVIG). )      Anesthesia Quick Evaluation

## 2019-06-12 ENCOUNTER — Inpatient Hospital Stay (HOSPITAL_COMMUNITY): Payer: BC Managed Care – PPO | Admitting: Vascular Surgery

## 2019-06-12 ENCOUNTER — Encounter (HOSPITAL_COMMUNITY): Payer: Self-pay

## 2019-06-12 ENCOUNTER — Inpatient Hospital Stay (HOSPITAL_COMMUNITY): Payer: BC Managed Care – PPO

## 2019-06-12 ENCOUNTER — Other Ambulatory Visit: Payer: Self-pay

## 2019-06-12 ENCOUNTER — Encounter (HOSPITAL_COMMUNITY)
Admission: RE | Disposition: A | Discharge status: Home / Self Care | Payer: Self-pay | Source: Home / Self Care | Attending: Cardiothoracic Surgery

## 2019-06-12 ENCOUNTER — Inpatient Hospital Stay (HOSPITAL_COMMUNITY): Payer: BC Managed Care – PPO | Admitting: Anesthesiology

## 2019-06-12 ENCOUNTER — Inpatient Hospital Stay (HOSPITAL_COMMUNITY)
Admission: RE | Admit: 2019-06-12 | Discharge: 2019-06-16 | DRG: 041 | Disposition: A | Discharge status: Home / Self Care | Payer: BC Managed Care – PPO | Attending: Cardiothoracic Surgery | Admitting: Cardiothoracic Surgery

## 2019-06-12 DIAGNOSIS — Z8349 Family history of other endocrine, nutritional and metabolic diseases: Secondary | ICD-10-CM | POA: Diagnosis not present

## 2019-06-12 DIAGNOSIS — Z79899 Other long term (current) drug therapy: Secondary | ICD-10-CM

## 2019-06-12 DIAGNOSIS — J95811 Postprocedural pneumothorax: Secondary | ICD-10-CM | POA: Diagnosis not present

## 2019-06-12 DIAGNOSIS — J939 Pneumothorax, unspecified: Secondary | ICD-10-CM

## 2019-06-12 DIAGNOSIS — Z4682 Encounter for fitting and adjustment of non-vascular catheter: Secondary | ICD-10-CM

## 2019-06-12 DIAGNOSIS — G7000 Myasthenia gravis without (acute) exacerbation: Secondary | ICD-10-CM | POA: Diagnosis present

## 2019-06-12 DIAGNOSIS — Z809 Family history of malignant neoplasm, unspecified: Secondary | ICD-10-CM

## 2019-06-12 HISTORY — PX: STERNOTOMY: SHX1057

## 2019-06-12 HISTORY — PX: THYMECTOMY: SHX6121

## 2019-06-12 LAB — POCT I-STAT 7, (LYTES, BLD GAS, ICA,H+H)
Bicarbonate: 24.5 mmol/L (ref 20.0–28.0)
Calcium, Ion: 1.26 mmol/L (ref 1.15–1.40)
HCT: 41 % (ref 39.0–52.0)
Hemoglobin: 13.9 g/dL (ref 13.0–17.0)
O2 Saturation: 97 %
Patient temperature: 97.9
Potassium: 4.1 mmol/L (ref 3.5–5.1)
Sodium: 136 mmol/L (ref 135–145)
TCO2: 26 mmol/L (ref 22–32)
pCO2 arterial: 37.4 mmHg (ref 32.0–48.0)
pH, Arterial: 7.422 (ref 7.350–7.450)
pO2, Arterial: 92 mmHg (ref 83.0–108.0)

## 2019-06-12 LAB — GLUCOSE, CAPILLARY
Glucose-Capillary: 145 mg/dL — ABNORMAL HIGH (ref 70–99)
Glucose-Capillary: 165 mg/dL — ABNORMAL HIGH (ref 70–99)

## 2019-06-12 SURGERY — STERNOTOMY
Anesthesia: General | Site: Chest

## 2019-06-12 MED ORDER — MIDAZOLAM HCL 2 MG/2ML IJ SOLN
INTRAMUSCULAR | Status: AC
  Filled 2019-06-12: qty 2

## 2019-06-12 MED ORDER — MEPERIDINE HCL 25 MG/ML IJ SOLN: 6.2500 mg | INTRAMUSCULAR | Status: DC | PRN

## 2019-06-12 MED ORDER — INSULIN ASPART 100 UNIT/ML ~~LOC~~ SOLN
0.0000 [IU] | Freq: Four times a day (QID) | SUBCUTANEOUS | Status: DC
  Administered 2019-06-12: 18:00:00 4 [IU] via SUBCUTANEOUS
  Administered 2019-06-12: 12:00:00 2 [IU] via SUBCUTANEOUS

## 2019-06-12 MED ORDER — VANCOMYCIN HCL IN DEXTROSE 1-5 GM/200ML-% IV SOLN
1000.0000 mg | Freq: Two times a day (BID) | INTRAVENOUS | Status: AC
  Administered 2019-06-12 – 2019-06-13 (×2): 1000 mg via INTRAVENOUS
  Filled 2019-06-12 (×2): qty 200

## 2019-06-12 MED ORDER — PREDNISONE 20 MG PO TABS
20.0000 mg | ORAL_TABLET | Freq: Every day | ORAL | Status: DC
  Administered 2019-06-14 – 2019-06-16 (×3): 20 mg via ORAL
  Filled 2019-06-12 (×3): qty 1

## 2019-06-12 MED ORDER — PREDNISONE 20 MG PO TABS: 30.0000 mg | ORAL_TABLET | Freq: Every day | ORAL | Status: DC

## 2019-06-12 MED ORDER — ONDANSETRON HCL 4 MG/2ML IJ SOLN
4.0000 mg | Freq: Four times a day (QID) | INTRAMUSCULAR | Status: DC | PRN
  Administered 2019-06-12: 4 mg via INTRAVENOUS
  Filled 2019-06-12: qty 2

## 2019-06-12 MED ORDER — TIMOLOL MALEATE 0.25 % OP SOLN
1.0000 [drp] | Freq: Two times a day (BID) | OPHTHALMIC | Status: DC
  Administered 2019-06-12 – 2019-06-16 (×9): 1 [drp] via OPHTHALMIC
  Filled 2019-06-12 (×2): qty 5

## 2019-06-12 MED ORDER — FENTANYL CITRATE (PF) 250 MCG/5ML IJ SOLN
INTRAMUSCULAR | Status: AC
  Filled 2019-06-12: qty 5

## 2019-06-12 MED ORDER — DEXAMETHASONE SODIUM PHOSPHATE 10 MG/ML IJ SOLN
INTRAMUSCULAR | Status: DC | PRN
  Administered 2019-06-12: 10 mg via INTRAVENOUS

## 2019-06-12 MED ORDER — SODIUM CHLORIDE (PF) 0.9 % IJ SOLN
INTRAMUSCULAR | Status: AC
  Filled 2019-06-12: qty 10

## 2019-06-12 MED ORDER — METHYLPREDNISOLONE SODIUM SUCC 125 MG IJ SOLR
80.0000 mg | Freq: Two times a day (BID) | INTRAMUSCULAR | Status: AC
  Administered 2019-06-12 – 2019-06-13 (×2): 80 mg via INTRAVENOUS
  Filled 2019-06-12 (×2): qty 2

## 2019-06-12 MED ORDER — FENTANYL CITRATE (PF) 250 MCG/5ML IJ SOLN
INTRAMUSCULAR | Status: AC
  Filled 2019-06-12: qty 10

## 2019-06-12 MED ORDER — HYDROMORPHONE HCL 1 MG/ML IJ SOLN
INTRAMUSCULAR | Status: AC
  Filled 2019-06-12: qty 1

## 2019-06-12 MED ORDER — ONDANSETRON HCL 4 MG/2ML IJ SOLN
INTRAMUSCULAR | Status: DC | PRN
  Administered 2019-06-12: 4 mg via INTRAVENOUS

## 2019-06-12 MED ORDER — SUGAMMADEX SODIUM 200 MG/2ML IV SOLN
INTRAVENOUS | Status: DC | PRN
  Administered 2019-06-12: 180 mg via INTRAVENOUS

## 2019-06-12 MED ORDER — HYDROMORPHONE HCL 1 MG/ML IJ SOLN
INTRAMUSCULAR | Status: AC
  Filled 2019-06-12: qty 0.5

## 2019-06-12 MED ORDER — PHENYLEPHRINE 40 MCG/ML (10ML) SYRINGE FOR IV PUSH (FOR BLOOD PRESSURE SUPPORT)
PREFILLED_SYRINGE | INTRAVENOUS | Status: AC
  Filled 2019-06-12: qty 10

## 2019-06-12 MED ORDER — SODIUM CHLORIDE (PF) 0.9 % IJ SOLN
OROMUCOSAL | Status: DC | PRN
  Administered 2019-06-12 (×2): 4 mL via TOPICAL

## 2019-06-12 MED ORDER — SUGAMMADEX SODIUM 200 MG/2ML IV SOLN: INTRAVENOUS | Status: DC | PRN

## 2019-06-12 MED ORDER — ACETAMINOPHEN 160 MG/5ML PO SOLN: 325.0000 mg | Freq: Once | ORAL | Status: DC | PRN

## 2019-06-12 MED ORDER — PROPOFOL 10 MG/ML IV BOLUS
INTRAVENOUS | Status: AC
  Filled 2019-06-12: qty 20

## 2019-06-12 MED ORDER — PHENYLEPHRINE HCL (PRESSORS) 10 MG/ML IV SOLN
INTRAVENOUS | Status: AC
  Filled 2019-06-12: qty 1

## 2019-06-12 MED ORDER — ACETAMINOPHEN 500 MG PO TABS
1000.0000 mg | ORAL_TABLET | Freq: Four times a day (QID) | ORAL | Status: DC
  Administered 2019-06-12 – 2019-06-16 (×13): 1000 mg via ORAL
  Filled 2019-06-12 (×14): qty 2

## 2019-06-12 MED ORDER — MYCOPHENOLATE MOFETIL 250 MG PO CAPS
1000.0000 mg | ORAL_CAPSULE | Freq: Two times a day (BID) | ORAL | Status: DC
  Administered 2019-06-12: 22:00:00 1000 mg via ORAL
  Filled 2019-06-12 (×2): qty 4

## 2019-06-12 MED ORDER — ACETAMINOPHEN 325 MG PO TABS: 325.0000 mg | ORAL_TABLET | Freq: Once | ORAL | Status: DC | PRN

## 2019-06-12 MED ORDER — ROCURONIUM BROMIDE 10 MG/ML (PF) SYRINGE
PREFILLED_SYRINGE | INTRAVENOUS | Status: DC | PRN
  Administered 2019-06-12: 10 mg via INTRAVENOUS
  Administered 2019-06-12: 20 mg via INTRAVENOUS
  Administered 2019-06-12: 40 mg via INTRAVENOUS

## 2019-06-12 MED ORDER — SODIUM CHLORIDE 0.9 % IV SOLN
0.0125 ug/kg/min | INTRAVENOUS | Status: DC
  Administered 2019-06-12: 08:00:00 .1 ug/kg/min via INTRAVENOUS
  Filled 2019-06-12: qty 2000

## 2019-06-12 MED ORDER — LACTATED RINGERS IV SOLN
INTRAVENOUS | Status: DC | PRN
  Administered 2019-06-12: 07:00:00 via INTRAVENOUS

## 2019-06-12 MED ORDER — TIMOLOL HEMIHYDRATE 0.25 % OP SOLN: 1.0000 [drp] | Freq: Two times a day (BID) | OPHTHALMIC | Status: DC

## 2019-06-12 MED ORDER — ONDANSETRON HCL 4 MG/2ML IJ SOLN
INTRAMUSCULAR | Status: AC
  Filled 2019-06-12: qty 2

## 2019-06-12 MED ORDER — FENTANYL CITRATE (PF) 250 MCG/5ML IJ SOLN
INTRAMUSCULAR | Status: DC | PRN
  Administered 2019-06-12: 50 ug via INTRAVENOUS
  Administered 2019-06-12: 150 ug via INTRAVENOUS
  Administered 2019-06-12: 50 ug via INTRAVENOUS

## 2019-06-12 MED ORDER — HYDRALAZINE HCL 20 MG/ML IJ SOLN
10.0000 mg | INTRAMUSCULAR | Status: DC | PRN
  Administered 2019-06-12 (×2): 10 mg via INTRAVENOUS
  Filled 2019-06-12 (×2): qty 1

## 2019-06-12 MED ORDER — BISACODYL 5 MG PO TBEC
10.0000 mg | DELAYED_RELEASE_TABLET | Freq: Every day | ORAL | Status: DC
  Administered 2019-06-13 – 2019-06-14 (×2): 10 mg via ORAL
  Filled 2019-06-12 (×2): qty 2

## 2019-06-12 MED ORDER — ROCURONIUM BROMIDE 10 MG/ML (PF) SYRINGE
PREFILLED_SYRINGE | INTRAVENOUS | Status: AC
  Filled 2019-06-12: qty 10

## 2019-06-12 MED ORDER — SODIUM CHLORIDE 0.9 % IV SOLN
INTRAVENOUS | Status: DC | PRN
  Administered 2019-06-12: 09:00:00 25 ug/min via INTRAVENOUS

## 2019-06-12 MED ORDER — LACTATED RINGERS IV SOLN: INTRAVENOUS | Status: DC

## 2019-06-12 MED ORDER — 0.9 % SODIUM CHLORIDE (POUR BTL) OPTIME
TOPICAL | Status: DC | PRN
  Administered 2019-06-12: 08:00:00 2000 mL

## 2019-06-12 MED ORDER — MIDAZOLAM HCL 2 MG/2ML IJ SOLN
INTRAMUSCULAR | Status: DC | PRN
  Administered 2019-06-12 (×2): 1 mg via INTRAVENOUS

## 2019-06-12 MED ORDER — ACETAMINOPHEN 160 MG/5ML PO SOLN: 1000.0000 mg | Freq: Four times a day (QID) | ORAL | Status: DC

## 2019-06-12 MED ORDER — POTASSIUM CHLORIDE IN NACL 20-0.9 MEQ/L-% IV SOLN
INTRAVENOUS | Status: DC
  Administered 2019-06-12 – 2019-06-13 (×2): via INTRAVENOUS
  Filled 2019-06-12 (×4): qty 1000

## 2019-06-12 MED ORDER — FENTANYL CITRATE (PF) 100 MCG/2ML IJ SOLN
25.0000 ug | INTRAMUSCULAR | Status: DC | PRN
  Administered 2019-06-12: 11:00:00 50 ug via INTRAVENOUS
  Filled 2019-06-12: qty 2

## 2019-06-12 MED ORDER — CEFAZOLIN SODIUM-DEXTROSE 2-4 GM/100ML-% IV SOLN
2.0000 g | INTRAVENOUS | Status: AC
  Administered 2019-06-12: 08:00:00 2 g via INTRAVENOUS
  Filled 2019-06-12: qty 100

## 2019-06-12 MED ORDER — HYDROMORPHONE HCL 1 MG/ML IJ SOLN
INTRAMUSCULAR | Status: DC | PRN
  Administered 2019-06-12: .2 mg via INTRAVENOUS

## 2019-06-12 MED ORDER — OXYCODONE HCL 5 MG PO TABS
5.0000 mg | ORAL_TABLET | ORAL | Status: DC | PRN
  Administered 2019-06-12 – 2019-06-16 (×8): 10 mg via ORAL
  Filled 2019-06-12 (×8): qty 2

## 2019-06-12 MED ORDER — KETOROLAC TROMETHAMINE 30 MG/ML IJ SOLN
15.0000 mg | Freq: Four times a day (QID) | INTRAMUSCULAR | Status: AC
  Administered 2019-06-12 (×2): 15 mg via INTRAVENOUS
  Filled 2019-06-12 (×2): qty 1

## 2019-06-12 MED ORDER — ACETAMINOPHEN 10 MG/ML IV SOLN
1000.0000 mg | Freq: Once | INTRAVENOUS | Status: DC | PRN
  Administered 2019-06-12: 1000 mg via INTRAVENOUS

## 2019-06-12 MED ORDER — SODIUM CHLORIDE 0.9 % IV SOLN
INTRAVENOUS | Status: DC | PRN
  Administered 2019-06-12: 15:00:00 250 mL via INTRAVENOUS

## 2019-06-12 MED ORDER — ACETAMINOPHEN 10 MG/ML IV SOLN
INTRAVENOUS | Status: AC
  Filled 2019-06-12: qty 100

## 2019-06-12 MED ORDER — PHENYLEPHRINE 40 MCG/ML (10ML) SYRINGE FOR IV PUSH (FOR BLOOD PRESSURE SUPPORT)
PREFILLED_SYRINGE | INTRAVENOUS | Status: DC | PRN
  Administered 2019-06-12: 40 ug via INTRAVENOUS

## 2019-06-12 MED ORDER — DEXAMETHASONE SODIUM PHOSPHATE 10 MG/ML IJ SOLN
INTRAMUSCULAR | Status: AC
  Filled 2019-06-12: qty 1

## 2019-06-12 MED ORDER — KETOROLAC TROMETHAMINE 30 MG/ML IJ SOLN
INTRAMUSCULAR | Status: DC | PRN
  Administered 2019-06-12: 30 mg via INTRAVENOUS

## 2019-06-12 MED ORDER — HYDROMORPHONE HCL 1 MG/ML IJ SOLN
0.2500 mg | INTRAMUSCULAR | Status: DC | PRN
  Administered 2019-06-12 (×2): 0.5 mg via INTRAVENOUS

## 2019-06-12 MED ORDER — TRAMADOL HCL 50 MG PO TABS
50.0000 mg | ORAL_TABLET | Freq: Four times a day (QID) | ORAL | Status: DC | PRN
  Administered 2019-06-13 (×2): 50 mg via ORAL
  Filled 2019-06-12 (×2): qty 1

## 2019-06-12 MED ORDER — PROMETHAZINE HCL 25 MG/ML IJ SOLN: 6.2500 mg | INTRAMUSCULAR | Status: DC | PRN

## 2019-06-12 MED ORDER — PROPOFOL 10 MG/ML IV BOLUS
INTRAVENOUS | Status: DC | PRN
  Administered 2019-06-12: 90 mg via INTRAVENOUS

## 2019-06-12 MED ORDER — HEMOSTATIC AGENTS (NO CHARGE) OPTIME
TOPICAL | Status: DC | PRN
  Administered 2019-06-12: 1 via TOPICAL

## 2019-06-12 MED ORDER — LIDOCAINE 2% (20 MG/ML) 5 ML SYRINGE
INTRAMUSCULAR | Status: AC
  Filled 2019-06-12: qty 5

## 2019-06-12 SURGICAL SUPPLY — 64 items
BLADE STERNUM SYSTEM 6 (BLADE) ×2 IMPLANT
CANISTER SUCT 3000ML PPV (MISCELLANEOUS) ×4 IMPLANT
CLIP VESOCCLUDE MED 24/CT (CLIP) ×3 IMPLANT
CLIP VESOCCLUDE SM WIDE 24/CT (CLIP) ×5 IMPLANT
CONN ST 1/4X3/8  BEN (MISCELLANEOUS) ×2
CONN ST 1/4X3/8 BEN (MISCELLANEOUS) IMPLANT
CONT SPEC 4OZ CLIKSEAL STRL BL (MISCELLANEOUS) ×2 IMPLANT
COVER WAND RF STERILE (DRAPES) ×1 IMPLANT
DERMABOND ADVANCED (GAUZE/BANDAGES/DRESSINGS)
DERMABOND ADVANCED .7 DNX12 (GAUZE/BANDAGES/DRESSINGS) ×1 IMPLANT
DRAIN CHANNEL 28F RND 3/8 FF (WOUND CARE) ×2 IMPLANT
DRAIN CHANNEL 32F RND 10.7 FF (WOUND CARE) IMPLANT
DRAIN HEMOVAC 1/8 X 5 (WOUND CARE) IMPLANT
DRAPE CARDIOVASCULAR INCISE (DRAPES) ×2
DRAPE LAPAROSCOPIC ABDOMINAL (DRAPES) ×1 IMPLANT
DRAPE SLUSH/WARMER DISC (DRAPES) ×2 IMPLANT
DRAPE SRG 135X102X78XABS (DRAPES) IMPLANT
DRESSING AQUACEL AG SP 3.5X10 (GAUZE/BANDAGES/DRESSINGS) IMPLANT
DRSG AQUACEL AG ADV 3.5X10 (GAUZE/BANDAGES/DRESSINGS) ×2 IMPLANT
DRSG AQUACEL AG SP 3.5X10 (GAUZE/BANDAGES/DRESSINGS) ×3
ELECT BLADE 6.5 EXT (BLADE) ×2 IMPLANT
ELECT REM PT RETURN 9FT ADLT (ELECTROSURGICAL) ×3
ELECTRODE REM PT RTRN 9FT ADLT (ELECTROSURGICAL) ×1 IMPLANT
EVACUATOR SILICONE 100CC (DRAIN) ×1 IMPLANT
FELT TEFLON 1X6 (MISCELLANEOUS) ×3 IMPLANT
GAUZE SPONGE 4X4 12PLY STRL (GAUZE/BANDAGES/DRESSINGS) ×2 IMPLANT
GAUZE SPONGE 4X4 12PLY STRL LF (GAUZE/BANDAGES/DRESSINGS) ×2 IMPLANT
GLOVE BIO SURGEON STRL SZ 6 (GLOVE) ×2 IMPLANT
GLOVE BIO SURGEON STRL SZ 6.5 (GLOVE) ×2 IMPLANT
GLOVE BIO SURGEON STRL SZ7.5 (GLOVE) ×6 IMPLANT
GLOVE BIO SURGEONS STRL SZ 6.5 (GLOVE) ×2
GLOVE BIOGEL PI IND STRL 6 (GLOVE) IMPLANT
GLOVE BIOGEL PI INDICATOR 6 (GLOVE) ×6
GOWN STRL REUS W/ TWL LRG LVL3 (GOWN DISPOSABLE) ×4 IMPLANT
GOWN STRL REUS W/TWL LRG LVL3 (GOWN DISPOSABLE) ×10
HEMOSTAT POWDER SURGIFOAM 1G (HEMOSTASIS) ×7 IMPLANT
HEMOSTAT SURGICEL 2X14 (HEMOSTASIS) IMPLANT
KIT BASIN OR (CUSTOM PROCEDURE TRAY) ×3 IMPLANT
KIT SUCTION CATH 14FR (SUCTIONS) ×2 IMPLANT
KIT TURNOVER KIT B (KITS) ×3 IMPLANT
NS IRRIG 1000ML POUR BTL (IV SOLUTION) ×6 IMPLANT
PACK CHEST (CUSTOM PROCEDURE TRAY) ×3 IMPLANT
PAD ARMBOARD 7.5X6 YLW CONV (MISCELLANEOUS) ×6 IMPLANT
PAD DEFIB STAT PADZ MULTI (MISCELLANEOUS) ×2 IMPLANT
SPONGE LAP 18X18 RF (DISPOSABLE) ×2 IMPLANT
SUT BONE WAX W31G (SUTURE) ×3 IMPLANT
SUT PROLENE 4 0 RB 1 (SUTURE) ×2
SUT PROLENE 4-0 RB1 .5 CRCL 36 (SUTURE) IMPLANT
SUT PROLENE 6 0 C 1 30 (SUTURE) ×4 IMPLANT
SUT SILK  1 MH (SUTURE) ×2
SUT SILK 1 MH (SUTURE) ×2 IMPLANT
SUT SILK 2 0 SH CR/8 (SUTURE) ×3 IMPLANT
SUT STEEL 6MS V (SUTURE) ×5 IMPLANT
SUT VIC AB 1 CTX 18 (SUTURE) ×2 IMPLANT
SUT VIC AB 1 CTX 27 (SUTURE) ×4 IMPLANT
SUT VIC AB 2-0 CTX 36 (SUTURE) ×4 IMPLANT
SUT VIC AB 3-0 X1 27 (SUTURE) ×4 IMPLANT
SYSTEM SAHARA CHEST DRAIN ATS (WOUND CARE) ×2 IMPLANT
TOWEL GREEN STERILE (TOWEL DISPOSABLE) ×3 IMPLANT
TOWEL GREEN STERILE FF (TOWEL DISPOSABLE) ×3 IMPLANT
TRAY FOL W/BAG SLVR 16FR STRL (SET/KITS/TRAYS/PACK) IMPLANT
TRAY FOLEY SLVR 14FR TEMP STAT (SET/KITS/TRAYS/PACK) ×1 IMPLANT
TRAY FOLEY W/BAG SLVR 16FR LF (SET/KITS/TRAYS/PACK) ×2
WATER STERILE IRR 1000ML POUR (IV SOLUTION) ×4 IMPLANT

## 2019-06-12 NOTE — Progress Notes (Signed)
[facility] BRIEF SICU PROGRESS NOTE  Day of Surgery  S/P Procedure(s) (LRB): PARTIAL STERNOTOMY (N/A) THYMECTOMY (N/A)   Doing well Good pain control NSR w/ stable BP Breathing comfortably w/ O2 sats 100%  Plan: Continue routine postop.  Resume home dose [name] and continue IV steroids  Rexene Alberts, MD 06/12/2019 6:19 PM

## 2019-06-12 NOTE — Brief Op Note (Addendum)
06/12/2019  9:57 AM  PATIENT:  Eddie [name]  33 y.o. male  PRE-OPERATIVE DIAGNOSIS:  1. Myasthenia gravis 2. Residual thymic tissue  POST-OPERATIVE DIAGNOSIS:  1. Myasthenia gravis 2. Residual thymic tissue  PROCEDURE: PARTIAL MEDIAN STERNOTOMY for THYMECTOMY   SURGEON:  Surgeon(s) and Role:    Ivin Poot, MD - Primary  PHYSICIAN ASSISTANT: Lars Pinks PA-C  ANESTHESIA:   general  EBL:  200 mL   BLOOD ADMINISTERED:none  DRAINS: 28 Blake chest tube placed in the mediastinal space   SPECIMEN:  Source of Specimen:  Thymus  DISPOSITION OF SPECIMEN:  PATHOLOGY  COUNTS CORRECT:  YES   DICTATION: .Dragon Dictation  PLAN OF CARE: Admit to inpatient   PATIENT DISPOSITION:  PACU - hemodynamically stable.   Delay start of Pharmacological VTE agent (>24hrs) due to surgical blood loss or risk of bleeding: yes

## 2019-06-12 NOTE — Progress Notes (Signed)
Pre Procedure note for inpatients:   Eddie [name] has been scheduled for Procedure(s): STERNOTOMY (N/A) THYMECTOMY (N/A) today. The various methods of treatment have been discussed with the patient. After consideration of the risks, benefits and treatment options the patient has consented to the planned procedure.   The patient has been seen and labs reviewed. There are no changes in the patient's condition to prevent proceeding with the planned procedure today.  Recent labs:  Lab Results  Component Value Date   WBC 6.0 06/08/2019   HGB 14.0 06/08/2019   HCT 44.4 06/08/2019   PLT 151 06/08/2019   GLUCOSE 98 06/08/2019   ALT 31 06/08/2019   AST 23 06/08/2019   NA 138 06/08/2019   K 3.4 (L) 06/08/2019   CL 106 06/08/2019   CREATININE 1.16 06/08/2019   BUN 20 06/08/2019   CO2 21 (L) 06/08/2019   TSH 0.77 03/17/2017   INR 1.2 06/08/2019   HGBA1C 5.8 (H) 04/04/2019    [name], MD 06/12/2019 7:12 AM

## 2019-06-12 NOTE — Anesthesia Procedure Notes (Signed)
Central Venous Catheter Insertion Performed by: Roberts Gaudy, MD, anesthesiologist Start/End7/14/2020 6:50 AM, 06/12/2019 7:00 AM Patient location: Pre-op. Preanesthetic checklist: patient identified, IV checked, site marked, risks and benefits discussed, surgical consent, monitors and equipment checked, pre-op evaluation, timeout performed and anesthesia consent Lidocaine 1% used for infiltration and patient sedated Hand hygiene performed  and maximum sterile barriers used  Catheter size: 8 Fr Total catheter length 16. Central line was placed.Double lumen Procedure performed using ultrasound guided technique. Ultrasound Notes:image(s) printed for medical record Attempts: 1 Following insertion, dressing applied and line sutured. Post procedure assessment: blood return through all ports  Patient tolerated the procedure well with no immediate complications.

## 2019-06-12 NOTE — H&P (Signed)
PCP is Elby Beck, FNP Referring Provider is No ref. provider found  No chief complaint on file. Patient examined, images of recent CT scan of chest personally reviewed and counseled with patient.  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  He is followed by Dr. Lavell Anchors.  He is currently on [name], prednisone 30 mg and last week he had IVIG in preparation for surgery.  He does not take Mestinon.  He has no symptoms of dyspnea, swallowing problems, or falls.  His symptoms mainly involve ptosis of the left eye and double vision which currently are fairly well-controlled.  The patient has had no major previous surgery.  He denies previous thoracic trauma.  He is right-hand dominant and denies bleeding disorder or previous blood transfusion.  He has no smoking history.  No history of cardiac disease murmur or arrhythmia.  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   Past Medical History:  Diagnosis Date  . Myasthenia gravis [facility])     Past Surgical History:  Procedure Laterality Date  . eyelid surgery Right    as a teenager    Family History  Problem Relation Age of Onset  . Cancer Mother   . Thyroid disease Father   . Thyroid disease Brother     Social History Social History   Tobacco Use  . Smoking status: Never Smoker  . Smokeless tobacco: Never Used  Substance Use Topics  . Alcohol use: Yes    Alcohol/week: 2.0 standard drinks    Types: 2 Shots of liquor per week  . Drug use: No    Current Facility-Administered Medications  Medication Dose Route Frequency Provider Last Rate Last Dose  . ceFAZolin (ANCEF) IVPB 2g/100 mL premix  2 g Intravenous 30 min Pre-Op Prescott Gum, Collier Salina, MD        No Known Allergies  Review of Systems                    Review of Systems :  [ y ] = yes, [  ] = no        General :  Weight gain [    ]    Weight loss  [   ]  Fatigue [  ]  Fever [  ]  Chills  [  ]                                          HEENT    Headache [  ]  Dizziness [  ]  Blurred vision Blue.Reese  ] Glaucoma  [  ]                          Nosebleeds [  ] Painful or loose teeth [  ]        Cardiac :  Chest pain/ pressure [  ]  Resting SOB [  ] exertional SOB [  ]                        Orthopnea [  ]  Pedal edema  [  ]  Palpitations [  ] Syncope/presyncope [ ]   Paroxysmal nocturnal dyspnea [  ]         Pulmonary : cough [  ]  wheezing [  ]  Hemoptysis [  ] Sputum [  ] Snoring [  ]                              Pneumothorax [  ]  Sleep apnea [  ]        GI : Vomiting [  ]  Dysphagia [  ]  Melena  [  ]  Abdominal pain [  ] BRBPR [  ]              Heart burn [  ]  Constipation [  ] Diarrhea  [  ] Colonoscopy [   ]        GU : Hematuria [  ]  Dysuria [  ]  Nocturia [  ] UTI's [  ]        Vascular : Claudication [  ]  Rest pain [  ]  DVT [  ] Vein stripping [  ] leg ulcers [  ]                          TIA [  ] Stroke [  ]  Varicose veins [  ]        NEURO :  Headaches  [  ] Seizures [  ] Vision changes [ y ] Paresthesias [  ]          brain MRI  normal                                     Musculoskeletal :  Arthritis [  ] Gout  [  ]  Back pain [  ]  Joint pain [  ]        Skin :  Rash [  ]  Melanoma [  ] Sores [  ]        Heme : Bleeding problems [  ]Clotting Disorders [  ] Anemia [  ]Blood Transfusion [ ]         Endocrine : Diabetes [  ] Heat or Cold intolerance [  ] Polyuria [  ]excessive thirst [ ]         Psych : Depression [  ]  Anxiety [  ]  Psych hospitalizations [  ] Memory change [  ]                                                                            BP (!) 134/92   Pulse 75   Temp 98.2 F (36.8 C) (Oral)   Resp 20   Ht 5\' 8"  (1.727 m)   Wt 65.8 kg   SpO2 99%   BMI 22.05 kg/m  Physical Exam      Physical Exam  General: Well-nourished middle-aged male no acute  distress HEENT: Normocephalic pupils equal , mild left ptosis, dentition adequate Neck: Supple without JVD, adenopathy, or bruit  Chest: Clear to auscultation, symmetrical breath sounds, no rhonchi, no tenderness             or deformity Cardiovascular: Regular rate and rhythm, no murmur, no gallop, peripheral pulses             palpable in all extremities Abdomen:  Soft, nontender, no palpable mass or organomegaly Extremities: Warm, well-perfused, no clubbing cyanosis edema or tenderness,              no venous stasis changes of the legs Rectal/GU: Deferred Neuro: Grossly non--focal and symmetrical throughout, good general strength, good bedside pulmonary mechanics Skin: Clean and dry without rash or ulceration    Diagnostic Tests: CT scan of chest shows 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  No thymoma noted.  Impression: With resection of residual thymic tissue the patient should show  clinical improvement and hopefully be able to wean or eliminate long-term immunosuppressive drugs. Patient will need partial sternotomy and approximately 4 to 5-day hospital stay.  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 I discussed the details of surgery, use of general anesthesia, the expected postoperative recovery, and the potential risks of bleeding, pain, wound infection, pulmonary problems including pleural effusion. Plan: Patient will be scheduled for partial sternotomy /thymectomy on July 14 at [place], MD [facility] [phone]

## 2019-06-12 NOTE — Anesthesia Procedure Notes (Signed)
Arterial Line Insertion Start/End7/14/2020 7:00 AM, 06/12/2019 7:15 AM Performed by: Roberts Gaudy, MD  Patient location: Pre-op. Preanesthetic checklist: patient identified, IV checked, site marked, risks and benefits discussed, surgical consent, monitors and equipment checked, pre-op evaluation, timeout performed and anesthesia consent Lidocaine 1% used for infiltration radial was placed Catheter size: 20 Fr Hand hygiene performed  and maximum sterile barriers used   Attempts: 1 Procedure performed without using ultrasound guided technique. Following insertion, dressing applied. Post procedure assessment: normal and unchanged  Patient tolerated the procedure well with no immediate complications.

## 2019-06-12 NOTE — Transfer of Care (Signed)
Immediate Anesthesia Transfer of Care Note  Patient: Eddie [name]  Procedure(s) Performed: PARTIAL STERNOTOMY (N/A Chest) THYMECTOMY (N/A Chest)  Patient Location: PACU  Anesthesia Type:General  Level of Consciousness: awake and alert   Airway & Oxygen Therapy: Patient Spontanous Breathing and Patient connected to face mask oxygen  Post-op Assessment: Report given to RN and Post -op Vital signs reviewed and stable  Post vital signs: Reviewed and stable  Last Vitals:  Vitals Value Taken Time  BP 153/89   Temp    Pulse 62 06/12/19 1011  Resp 11 06/12/19 1011  SpO2 100 % 06/12/19 1011  Vitals shown include unvalidated device data.  Last Pain:  Vitals:   06/12/19 0614  TempSrc:   PainSc: 0-No pain         Complications: No apparent anesthesia complications

## 2019-06-12 NOTE — Op Note (Signed)
NAME: MURLIN, SCHRIEBER MEDICAL RECORD NI:[id number] ACCOUNT [id number] DATE OF BIRTH:[birth year] FACILITY: [facility] LOCATION: [facility] PHYSICIAN:Dontavis Tschantz VAN TRIGT III, MD  OPERATIVE REPORT  DATE OF PROCEDURE:  06/12/2019  OPERATION:  Partial sternotomy for thymectomy.  SURGEON:  [name], MD  ASSISTANT:  Lars Pinks, PA-C  PREOPERATIVE DIAGNOSIS:  Myasthenia gravis.  POSTOPERATIVE DIAGNOSIS:  Myasthenia gravis.  ANESTHESIA:  General.  DESCRIPTION OF PROCEDURE:  After the patient had been seen in the preop holding where informed consent was documented and final questions addressed, the patient was brought to the operating room.  The patient had previously been fully evaluated in the  office during consultation for recommended thymectomy by his neurologist for myasthenia.  I discussed the procedure in detail including the use of general anesthesia, the location of the incision, the expected postoperative recovery, and the potential  risks of bleeding, infection, muscular weakness requiring ventilator assistance, postoperative pulmonary problems including pleural effusion or diaphragm dysfunction, or organ failure.  The patient demonstrated his understanding and agreed to proceed  with surgery under informed consent.  The patient was placed supine on the operating table, and general anesthesia was induced under invasive monitoring including arterial line transducing.  The patient was intubated and remained stable.  The chest was prepped and draped as a sterile field.   A proper time-out was performed.  A partial sternal incision was made.  A partial sternotomy was made, and the sternum was retracted using the laminar spreaders.  The anterior mediastinum was inspected.  There was no thymic tumor, but the thymic tissue  extended from the innominate vein and the anterior mediastinum down to the mid pericardium.  Using careful dissection, the thymic tissue was removed from the  anterior mediastinum, taken off the pericardium in both pleural surfaces as well as the innominate vein and extending into the neck.  The gland was removed in its entirety.  The operative  field was checked and hemostasis was achieved.  A 28-French Blake drain was placed in the anterior mediastinum and secured to the skin.  The sternum was then closed with interrupted steel wire.  Hemostasis was adequate.  The pectoralis muscle, the  subcutaneous layer, and the skin were all closed with running Vicryl.  A sterile dressing was applied and the chest tube connected to an underwater seal suction system.  The patient was extubated in the operating room and returned to recovery room in  stable condition.  LN/NUANCE  D:06/12/2019 T:06/12/2019 JOB:[id number]

## 2019-06-12 NOTE — Anesthesia Procedure Notes (Signed)
Procedure Name: Intubation Date/Time: 06/12/2019 7:58 AM Performed by: Bryson Corona, CRNA Pre-anesthesia Checklist: Patient identified, Emergency Drugs available, Suction available and Patient being monitored Patient Re-evaluated:Patient Re-evaluated prior to induction Oxygen Delivery Method: Circle System Utilized Preoxygenation: Pre-oxygenation with 100% oxygen Induction Type: IV induction Ventilation: Mask ventilation without difficulty Laryngoscope Size: Mac and 4 Grade View: Grade I Tube type: Oral Tube size: 8.0 mm Number of attempts: 1 Airway Equipment and Method: Stylet and Oral airway Placement Confirmation: ETT inserted through vocal cords under direct vision,  positive ETCO2 and breath sounds checked- equal and bilateral Secured at: 23 cm Tube secured with: Tape Dental Injury: Teeth and Oropharynx as per pre-operative assessment

## 2019-06-12 NOTE — Progress Notes (Signed)
RN received call from [facility] regarding patient's CXR results. Dr. Darcey Nora notified - stated he was already aware, no new orders.   Dr. Prescott Gum also notified of patient's elevated BP. Orders received for prn hydralazine.  Joellen Jersey, RN

## 2019-06-12 NOTE — Anesthesia Postprocedure Evaluation (Signed)
Anesthesia Post Note  Patient: Eddie [name]  Procedure(s) Performed: PARTIAL STERNOTOMY (N/A Chest) THYMECTOMY (N/A Chest)     Patient location during evaluation: PACU Anesthesia Type: General Level of consciousness: awake and alert Pain management: pain level controlled Vital Signs Assessment: post-procedure vital signs reviewed and stable Respiratory status: spontaneous breathing, nonlabored ventilation, respiratory function stable and patient connected to nasal cannula oxygen Cardiovascular status: blood pressure returned to baseline and stable Postop Assessment: no apparent nausea or vomiting Anesthetic complications: no    Last Vitals:  Vitals:   06/12/19 1415 06/12/19 1430  BP:  128/83  Pulse: 86 73  Resp: (!) 21 19  Temp:    SpO2: 100% 99%    Last Pain:  Vitals:   06/12/19 1353  TempSrc:   PainSc: 3                  Effie Berkshire

## 2019-06-13 ENCOUNTER — Encounter (HOSPITAL_COMMUNITY): Payer: Self-pay | Admitting: Cardiothoracic Surgery

## 2019-06-13 ENCOUNTER — Inpatient Hospital Stay (HOSPITAL_COMMUNITY): Payer: BC Managed Care – PPO

## 2019-06-13 DIAGNOSIS — J95811 Postprocedural pneumothorax: Secondary | ICD-10-CM

## 2019-06-13 LAB — CBC
HCT: 39.6 % (ref 39.0–52.0)
Hemoglobin: 12.4 g/dL — ABNORMAL LOW (ref 13.0–17.0)
MCH: 28.4 pg (ref 26.0–34.0)
MCHC: 31.3 g/dL (ref 30.0–36.0)
MCV: 90.8 fL (ref 80.0–100.0)
Platelets: 158 10*3/uL (ref 150–400)
RBC: 4.36 MIL/uL (ref 4.22–5.81)
RDW: 13.5 % (ref 11.5–15.5)
WBC: 7.8 10*3/uL (ref 4.0–10.5)
nRBC: 0 % (ref 0.0–0.2)

## 2019-06-13 LAB — POCT I-STAT 7, (LYTES, BLD GAS, ICA,H+H)
Acid-Base Excess: 3 mmol/L — ABNORMAL HIGH (ref 0.0–2.0)
Bicarbonate: 28 mmol/L (ref 20.0–28.0)
Calcium, Ion: 1.32 mmol/L (ref 1.15–1.40)
HCT: 38 % — ABNORMAL LOW (ref 39.0–52.0)
Hemoglobin: 12.9 g/dL — ABNORMAL LOW (ref 13.0–17.0)
O2 Saturation: 97 %
Potassium: 4.1 mmol/L (ref 3.5–5.1)
Sodium: 140 mmol/L (ref 135–145)
TCO2: 29 mmol/L (ref 22–32)
pCO2 arterial: 41.8 mmHg (ref 32.0–48.0)
pH, Arterial: 7.434 (ref 7.350–7.450)
pO2, Arterial: 88 mmHg (ref 83.0–108.0)

## 2019-06-13 LAB — BASIC METABOLIC PANEL
Anion gap: 7 (ref 5–15)
BUN: 8 mg/dL (ref 6–20)
CO2: 25 mmol/L (ref 22–32)
Calcium: 9.1 mg/dL (ref 8.9–10.3)
Chloride: 106 mmol/L (ref 98–111)
Creatinine, Ser: 1.13 mg/dL (ref 0.61–1.24)
GFR calc Af Amer: >60 mL/min (ref >60)
GFR calc non Af Amer: >60 mL/min (ref >60)
Glucose, Bld: 103 mg/dL — ABNORMAL HIGH (ref 70–99)
Potassium: 4 mmol/L (ref 3.5–5.1)
Sodium: 138 mmol/L (ref 135–145)

## 2019-06-13 LAB — GLUCOSE, CAPILLARY
Glucose-Capillary: 103 mg/dL — ABNORMAL HIGH (ref 70–99)
Glucose-Capillary: 109 mg/dL — ABNORMAL HIGH (ref 70–99)
Glucose-Capillary: 155 mg/dL — ABNORMAL HIGH (ref 70–99)

## 2019-06-13 MED ORDER — CHLORHEXIDINE GLUCONATE CLOTH 2 % EX PADS
6.0000 | MEDICATED_PAD | Freq: Every day | CUTANEOUS | Status: DC
  Administered 2019-06-13 – 2019-06-16 (×4): 6 via TOPICAL

## 2019-06-13 MED ORDER — MIDAZOLAM HCL 2 MG/2ML IJ SOLN
INTRAMUSCULAR | Status: AC
  Filled 2019-06-13: qty 2

## 2019-06-13 MED ORDER — MIDAZOLAM HCL 2 MG/2ML IJ SOLN
1.0000 mg | Freq: Once | INTRAMUSCULAR | Status: AC
  Administered 2019-06-13: 11:00:00 1 mg via INTRAVENOUS

## 2019-06-13 MED ORDER — MORPHINE SULFATE (PF) 4 MG/ML IV SOLN
4.0000 mg | Freq: Once | INTRAVENOUS | Status: AC
  Administered 2019-06-13: 11:00:00 4 mg via INTRAVENOUS

## 2019-06-13 MED ORDER — MORPHINE SULFATE (PF) 4 MG/ML IV SOLN
INTRAVENOUS | Status: AC
  Filled 2019-06-13: qty 1

## 2019-06-13 NOTE — Discharge Summary (Addendum)
Physician Discharge Summary       [address]       [place] [address]             [phone]    Patient ID: Eddie [name] MRN: [id number] DOB/AGE: 1986-09-26 33 y.o.  Admit date: 06/12/2019 Discharge date: 06/22/2019  Admission Diagnoses: Myasthenia gravis [facility])  Discharge Diagnoses:  S/p partial sternotomy, thymectomy   Procedure (s): Partial sternotomy for thymectomy.  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  He is followed by Dr. Lavell Anchors.  He is currently on [name], prednisone 30 mg and last week he had IVIG in preparation for surgery.  He does not take Mestinon.  He has no symptoms of dyspnea, swallowing problems, or falls.  His symptoms mainly involve ptosis of the left eye and double vision which currently are fairly well-controlled.  The patient has had no major previous surgery.  He denies previous thoracic trauma.  He is right-hand dominant and denies bleeding disorder or previous blood transfusion.  He has no smoking history.  No history of cardiac disease murmur or arrhythmia.  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  With resection of residual thymic tissue the patient should show  clinical improvement and hopefully be able to wean or eliminate long-term immunosuppressive drugs. Patient will need partial sternotomy and approximately 4 to 5-day hospital stay.  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 Dr. Prescott Gum discussed the details of surgery, use of general anesthesia, the expected postoperative recovery, and the potential risks. He was admitted on 07/14 in order to undergo a partial sternotomy and thymectomy.  Brief Hospital Course:  The patient remained afebrile and hemodynamically stable. A line  and foley were removed early in the post operative course. Chest tube output gradually decreased. He was found to have right pneumothorax on post op day one that worsened and required a pigtail chest tube. Neither chest tube had an air leak. Daily chest x rays were obtained and remained stable. Mediastinal chest tube was removed on 07/16. There was n air leak and right pigtail chest tube was removed on 07/17. Patient is ambulating on room air. He was given IV Solumedrol for a few doses post op and then was put on oral Prednisone 20 mg daily on 07/16. He was also restarted on Cellsept on 07/16. He had a few runs of SVT on 07/16 so he was started on Toprol XL 12.5 mg daily. Patient is tolerating a diet and has had a bowel movement. Wounds are clean and dry. Final chest X ray showed stable findings . Patient is felt surgically stable for discharge today.  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 - NO EVIDENCE OF MALIGNANCY.  Latest Vital Signs: Blood pressure 120/70, pulse 91, temperature 98 F (36.7 C), temperature source Oral, resp. rate (!) 23, height 5\' 8"  (1.727 m), weight 66.1 kg, SpO2 100 %.  Physical Exam: General appearance: alert, cooperative and no distress Heart: regular rate and rhythm Lungs: clear to auscultation bilaterally and min dim in bases Abdomen: benign Extremities: no edema or calf tenderness Wound: incis healing well Discharge Condition: Stable and discharged to home.  Recent laboratory studies:  Lab Results  Component Value Date   WBC 6.2 06/15/2019  HGB 12.3 (L) 06/15/2019   HCT 39.4 06/15/2019   MCV 90.2 06/15/2019   PLT 152 06/15/2019   Lab Results  Component Value Date   NA 137 06/15/2019   K 4.0 06/15/2019   CL 102 06/15/2019   CO2 27 06/15/2019   CREATININE 1.13 06/15/2019   GLUCOSE 89 06/15/2019      Diagnostic Studies: Dg Chest 1 View  Result Date: 06/15/2019 CLINICAL DATA:  Follow-up chest tube EXAM: CHEST  1 VIEW  COMPARISON:  06/14/2019 FINDINGS: Pigtail catheter is again noted on the right. A tiny apical pneumothorax is noted stable in appearance from the prior exam. The lungs are otherwise clear. Postsurgical changes are again noted. Cardiac shadow is stable. IMPRESSION: Tiny right-sided pneumothorax similar to that seen on previous exam. Electronically Signed   By: Inez Catalina M.D.   On: 06/15/2019 09:42   Dg Chest 1 View  Result Date: 06/14/2019 CLINICAL DATA:  Follow-up RIGHT pneumothorax with chest tube in place. EXAM: Portable CHEST 1 VIEW COMPARISON:  06/13/2019 and earlier. FINDINGS: Prior sternotomy. Cardiac silhouette upper normal in size for AP portable technique, unchanged. Small bore RIGHT chest tube in place with residual small (5-10% or so) RIGHT LATERAL pneumothorax. Lungs clear. Pulmonary vascularity normal. No visible pleural effusions. IMPRESSION: 1. Small bore RIGHT chest tube in place with residual small (5-10% or so) RIGHT LATERAL pneumothorax. 2.  No acute cardiopulmonary disease otherwise. Electronically Signed   By: Evangeline Dakin M.D.   On: 06/14/2019 09:13   Dg Chest 2 View  Result Date: 06/16/2019 CLINICAL DATA:  Follow-up pneumothorax EXAM: CHEST - 2 VIEW COMPARISON:  06/15/2019 FINDINGS: Tiny amount of pleural air remains visible at the right apex. No increase. The lungs are otherwise clear. Previous median sternotomy. Normal vascularity. IMPRESSION: Tiny amount of residual pleural air at the right apex, not increased. Electronically Signed   By: Nelson Chimes M.D.   On: 06/16/2019 08:39   Dg Chest 2 View  Result Date: 06/08/2019 CLINICAL DATA:  33 year old with myasthenia gravis and preoperative evaluation. EXAM: CHEST - 2 VIEW COMPARISON:  Chest CT 05/15/2019 FINDINGS: Both lungs are clear. Heart and mediastinum are within normal limits. Trachea is midline. Negative for a pneumothorax. No pleural effusions. Bone structures are unremarkable. IMPRESSION: No active cardiopulmonary  disease. Electronically Signed   By: Markus Daft M.D.   On: 06/08/2019 09:17   Dg Chest Port 1 View  Result Date: 06/15/2019 CLINICAL DATA:  Status post chest tube removal EXAM: PORTABLE CHEST 1 VIEW COMPARISON:  Film from earlier in the same day. FINDINGS: Right-sided chest tube has been removed. Tiny pneumothorax remains although decreased in size when compared with the prior exam. No new focal abnormality is seen. Postsurgical changes are noted. IMPRESSION: Minimal residual pneumothorax following chest tube removal. Electronically Signed   By: Inez Catalina M.D.   On: 06/15/2019 11:23   Dg Chest Port 1 View  Result Date: 06/13/2019 CLINICAL DATA:  Status post right chest tube placement today for pneumothorax. EXAM: PORTABLE CHEST 1 VIEW COMPARISON:  Single-view of the chest earlier today. FINDINGS: New right chest tube is in place. Right pneumothorax seen on the examination earlier today has almost completely resolved. Small residual is estimated at 5-10%. Left lung is expanded and clear. Heart size is normal. Mediastinal drain and right IJ catheter are unchanged. IMPRESSION: Right chest tube in place. Small residual right pneumothorax is estimated at 5-10%. No other change compared to the study earlier today. Electronically Signed   By:  Inge Rise M.D.   On: 06/13/2019 11:24   Dg Chest Port 1 View  Result Date: 06/13/2019 CLINICAL DATA:  Recent median sternotomy EXAM: PORTABLE CHEST 1 VIEW COMPARISON:  06/12/2019 FINDINGS: Cardiac shadows within normal limits. Postsurgical changes are noted. Right jugular central line and mediastinal drain are again noted. There is a new moderate size right-sided pneumothorax with approximately 4 cm excursion at the apex. This has increased in size when compared with the prior study. The tiny left apical pneumothorax is again noted. No other focal abnormality is noted. IMPRESSION: Significant increase in right-sided pneumothorax when compared with the prior exam.  Stable left pneumothorax. These results will be called to the ordering clinician or representative by the Radiologist Assistant, and communication documented in the PACS or zVision Dashboard. Electronically Signed   By: Inez Catalina M.D.   On: 06/13/2019 08:13   Dg Chest Port 1 View  Result Date: 06/12/2019 CLINICAL DATA:  Status post sternotomy, evaluate pneumothorax EXAM: PORTABLE CHEST 1 VIEW COMPARISON:  06/08/2019 FINDINGS: Status post interval median sternotomy. There is a small, approximately 15% right apical pneumothorax and a tiny, less than 5% left apical pneumothorax. Right neck vascular catheter. Mediastinal drainage tube. IMPRESSION: Status post interval median sternotomy. There is a small, approximately 15% right apical pneumothorax and a tiny, less than 5% left apical pneumothorax. Right neck vascular catheter. Mediastinal drainage tube. These results will be called to the ordering clinician or representative by the Radiologist Assistant, and communication documented in the PACS or zVision Dashboard. Electronically Signed   By: Eddie Candle M.D.   On: 06/12/2019 11:13    Discharge Instructions    Discharge patient   Complete by: As directed    Discharge disposition: 01-Home or Self Care   Discharge patient date: 06/16/2019      Discharge Medications: Allergies as of 06/16/2019   No Known Allergies     Medication List    TAKE these medications   mycophenolate 500 MG tablet Commonly known as: [name] Take 2 tablets (1,000 mg total) by mouth 2 (two) times daily. Notes to patient: This evening   oxyCODONE 5 MG immediate release tablet Commonly known as: Oxy IR/ROXICODONE Take 1-2 tablets (5-10 mg total) by mouth every 6 (six) hours as needed for up to 7 days for moderate pain.   predniSONE 20 MG tablet Commonly known as: DELTASONE Taper as per neurologist instructions What changed: See the new instructions.   PROTEIN SUPPLEMENT 80% PO Take 1 Scoop by mouth 2 (two)  times a day. MIXED WITH MILK Notes to patient: This evening   timolol 0.25 % ophthalmic solution Commonly known as: BETIMOL Place 1 drop into both eyes 2 (two) times daily. Notes to patient: This evening       Follow Up Appointments: Follow-up Information    Prescott Gum, Collier Salina, MD. Go on 06/27/2019.   Specialty: Cardiothoracic Surgery Why: PA/LAT CXR to be taken (at [facility] which is in the same building as Dr. Lucianne Lei Trigt's office) on a07/29 at 2:00 pm;Appointment time is at 2:30 pm Contact information: [place] [address] [phone]           Signed: Gaspar Bidding 06/22/2019, 9:54 AM   patient examined and medical record reviewed,agree with above note. Tharon Aquas Trigt III 06/25/2019

## 2019-06-13 NOTE — Progress Notes (Signed)
Patient asking RN if he will be receiving his [name] medication today.  RN reviewed orders and medication was discontinued by PA Tacy Dura.  Paged and spoke with PA Tacy Dura, PA advised medication was discontinued per MD Prescott Gum and MD advised will review restarting this medication in the next few days.  Patient informed of same.

## 2019-06-13 NOTE — Progress Notes (Signed)
Assumed care from [facility]. Reassessment done and agree with previous assessment. Pt currently in bed. No complaints at this time. Vital signs stable. Will continue to monitor

## 2019-06-13 NOTE — Progress Notes (Addendum)
[facility] DAILY ICU PROGRESS NOTE                   [address]            [place] [address]          [phone]   1 Day Post-Op Procedure(s) (LRB): PARTIAL STERNOTOMY (N/A) THYMECTOMY (N/A)  Total Length of Stay:  LOS: 1 day   Subjective: Patient with some sternal, incisional pain, but not "too bad". He has been tolerating liquids and is eating breakfast without difficulty this am.  Objective: Vital signs in last 24 hours: Temp:  [97.2 F (36.2 C)-98.8 F (37.1 C)] 98.6 F (37 C) (07/15 0400) Pulse Rate:  [53-115] 96 (07/15 0600) Cardiac Rhythm: Normal sinus rhythm (07/15 0400) Resp:  [11-25] 21 (07/15 0600) BP: (87-146)/(59-105) 128/88 (07/15 0600) SpO2:  [96 %-100 %] 100 % (07/15 0600) Arterial Line BP: (111-193)/(53-96) 160/94 (07/15 0600) Weight:  [66.1 kg] 66.1 kg (07/15 0500)  Filed Weights   06/12/19 0549 06/13/19 0500  Weight: 65.8 kg 66.1 kg    Weight change: 0.328 kg      Intake/Output from previous day: 07/14 0701 - 07/15 0700 In: 3961 [P.O.:240; I.V.:3221; IV Piggyback:499.9] Out: 3830 [Urine:3400; Blood:200; Chest Tube:230]  Intake/Output this shift: No intake/output data recorded.  Current Meds: Scheduled Meds: . acetaminophen  1,000 mg Oral Q6H   Or  . acetaminophen (TYLENOL) oral liquid 160 mg/5 mL  1,000 mg Oral Q6H  . bisacodyl  10 mg Oral Daily  . insulin aspart  0-24 Units Subcutaneous Q6H  . methylPREDNISolone (SOLU-MEDROL) injection  80 mg Intravenous Q12H  . mycophenolate  1,000 mg Oral BID  . [START ON 06/14/2019] predniSONE  20 mg Oral QAC breakfast  . timolol  1 drop Both Eyes BID   Continuous Infusions: . sodium chloride 10 mL/hr at 06/13/19 0600  . 0.9 % NaCl with KCl 20 mEq / L 100 mL/hr at 06/13/19 0600   PRN Meds:.sodium chloride, fentaNYL (SUBLIMAZE) injection, hydrALAZINE, ondansetron (ZOFRAN) IV, oxyCODONE, traMADol  General appearance: alert, cooperative and no distress Neurologic: intact Heart: RRR  Lungs: Mostly clear Abdomen: Soft, non tender, bowel sounds present Extremities: No LE edema Wound: Aquacel intact  Lab Results: CBC: Recent Labs    06/12/19 1521 06/13/19 0405  WBC  --  7.8  HGB 13.9 12.4*  HCT 41.0 39.6  PLT  --  158   BMET:  Recent Labs    06/12/19 1521 06/13/19 0405  NA 136 138  K 4.1 4.0  CL  --  106  CO2  --  25  GLUCOSE  --  103*  BUN  --  8  CREATININE  --  1.13  CALCIUM  --  9.1    CMET: Lab Results  Component Value Date   WBC 7.8 06/13/2019   HGB 12.4 (L) 06/13/2019   HCT 39.6 06/13/2019   PLT 158 06/13/2019   GLUCOSE 103 (H) 06/13/2019   ALT 31 06/08/2019   AST 23 06/08/2019   NA 138 06/13/2019   K 4.0 06/13/2019   CL 106 06/13/2019   CREATININE 1.13 06/13/2019   BUN 8 06/13/2019   CO2 25 06/13/2019   TSH 0.77 03/17/2017   INR 1.2 06/08/2019   HGBA1C 5.8 (H) 04/04/2019      PT/INR: No results for input(s): LABPROT, INR in the last 72 hours. Radiology: Dg Chest Port 1 View  Result Date: 06/12/2019 CLINICAL DATA:  Status post sternotomy, evaluate  pneumothorax EXAM: PORTABLE CHEST 1 VIEW COMPARISON:  06/08/2019 FINDINGS: Status post interval median sternotomy. There is a small, approximately 15% right apical pneumothorax and a tiny, less than 5% left apical pneumothorax. Right neck vascular catheter. Mediastinal drainage tube. IMPRESSION: Status post interval median sternotomy. There is a small, approximately 15% right apical pneumothorax and a tiny, less than 5% left apical pneumothorax. Right neck vascular catheter. Mediastinal drainage tube. These results will be called to the ordering clinician or representative by the Radiologist Assistant, and communication documented in the PACS or zVision Dashboard. Electronically Signed   By: Eddie Candle M.D.   On: 06/12/2019 11:13     Assessment/Plan: S/P Procedure(s) (LRB): PARTIAL STERNOTOMY (N/A) THYMECTOMY (N/A) 1. CV- SR in the 90's.Hypertensive this am. 2. Pulmonary-On room  air. Chest tube with 230 cc since surgery. CXR this am appears stable (trace left apical pneumothorax). Will place chest tube to water seal. Encourage incentive spirometer. Await final pathology. 3. CBGs 165/109/103. No prior history of diabetes. Will stop accu checks and SS PRN 4. Remove a line and foley    Donielle M Zimmerman PA-C  Moderate R pneumothorax- will place pigtail catheter and remove R IJ central line No Ptosis or ocular facial weakness  patient examined and medical record reviewed,agree with above note. Tharon Aquas Trigt III 06/13/2019    06/13/2019 7:05 AM

## 2019-06-13 NOTE — Discharge Instructions (Signed)

## 2019-06-13 NOTE — Progress Notes (Addendum)
1330 - Patient ambulated 340 feet.  Patient's heart rate increased to 160's, sinus tach with stable oxygen saturations and blood pressure.  Patient stated he was asymptomatic and felt "good" walking.  After ambulation patient's heart rate remained at prior baseline tachycardia of 100-110's.     1545 - patient ambulated 340 ft.  Patient's heart rate remained stable in the 70-80's with no tachycardia.

## 2019-06-13 NOTE — Progress Notes (Signed)
CRITICAL VALUE ALERT  Critical Value:  Chest x-ray results: There is a new moderate size right-sided pneumothorax with approximately 4 cm excursion at the apex. This has increased in size when compared with the prior study  Date & Time Notied:  06/13/2019 at 0830  Provider Notified: PA Zimmernam  Orders Received/Actions taken: PA advised chest xray had been reviewed and no new orders at this time.

## 2019-06-13 NOTE — Progress Notes (Signed)
1 mg Versed wasted in sterile container witnessed by Eloise Harman RN.

## 2019-06-13 NOTE — Op Note (Signed)
Procedure-right chest tube pigtail catheter  Surgeon-Peter Vantrigt MD  Pre-and postoperative diagnosis  -moderate 40% right pneumothorax status post sternotomy-thymectomy   Anesthesia local 1% lidocaine with IV conscious monitored sedation in the ICU   Procedure-after informed consent was obtained the right chest was prepped and draped as a sterile field.  A proper timeout was performed.  Lidocaine 1% lidocaine was infiltrated at the third interspace.  A small incision was made.  A guidewire was placed in the pleural space using the Seldinger technique.  A dilator then the pigtail catheter was inserted in the pleural space, connected to Pleur-evac underwater seal drainage system and secured with a skin suture.  A sterile dressing was applied.  A chest x-ray is pending.  The patient tolerated procedure well.

## 2019-06-14 ENCOUNTER — Inpatient Hospital Stay (HOSPITAL_COMMUNITY): Payer: BC Managed Care – PPO

## 2019-06-14 LAB — COMPREHENSIVE METABOLIC PANEL
ALT: 18 U/L (ref 0–44)
AST: 22 U/L (ref 15–41)
Albumin: 3 g/dL — ABNORMAL LOW (ref 3.5–5.0)
Alkaline Phosphatase: 24 U/L — ABNORMAL LOW (ref 38–126)
Anion gap: 8 (ref 5–15)
BUN: 13 mg/dL (ref 6–20)
CO2: 28 mmol/L (ref 22–32)
Calcium: 9.1 mg/dL (ref 8.9–10.3)
Chloride: 103 mmol/L (ref 98–111)
Creatinine, Ser: 1.13 mg/dL (ref 0.61–1.24)
GFR calc Af Amer: >60 mL/min (ref >60)
GFR calc non Af Amer: >60 mL/min (ref >60)
Glucose, Bld: 108 mg/dL — ABNORMAL HIGH (ref 70–99)
Potassium: 3.8 mmol/L (ref 3.5–5.1)
Sodium: 139 mmol/L (ref 135–145)
Total Bilirubin: 0.6 mg/dL (ref 0.3–1.2)
Total Protein: 6.3 g/dL — ABNORMAL LOW (ref 6.5–8.1)

## 2019-06-14 LAB — CBC
HCT: 39.6 % (ref 39.0–52.0)
Hemoglobin: 12.3 g/dL — ABNORMAL LOW (ref 13.0–17.0)
MCH: 28.7 pg (ref 26.0–34.0)
MCHC: 31.1 g/dL (ref 30.0–36.0)
MCV: 92.3 fL (ref 80.0–100.0)
Platelets: 160 10*3/uL (ref 150–400)
RBC: 4.29 MIL/uL (ref 4.22–5.81)
RDW: 13.6 % (ref 11.5–15.5)
WBC: 6.7 10*3/uL (ref 4.0–10.5)
nRBC: 0 % (ref 0.0–0.2)

## 2019-06-14 MED ORDER — METOPROLOL SUCCINATE ER 25 MG PO TB24
12.5000 mg | ORAL_TABLET | Freq: Every day | ORAL | Status: DC
  Administered 2019-06-14 – 2019-06-15 (×2): 12.5 mg via ORAL
  Filled 2019-06-14 (×2): qty 1

## 2019-06-14 MED ORDER — KETOROLAC TROMETHAMINE 30 MG/ML IJ SOLN
15.0000 mg | Freq: Four times a day (QID) | INTRAMUSCULAR | Status: DC
  Administered 2019-06-14 – 2019-06-16 (×9): 15 mg via INTRAVENOUS
  Filled 2019-06-14 (×9): qty 1

## 2019-06-14 MED ORDER — POTASSIUM CHLORIDE CRYS ER 20 MEQ PO TBCR
30.0000 meq | EXTENDED_RELEASE_TABLET | Freq: Once | ORAL | Status: AC
  Administered 2019-06-14: 30 meq via ORAL
  Filled 2019-06-14: qty 1

## 2019-06-14 MED ORDER — MYCOPHENOLATE MOFETIL 250 MG PO CAPS
1000.0000 mg | ORAL_CAPSULE | Freq: Two times a day (BID) | ORAL | Status: DC
  Administered 2019-06-14 – 2019-06-16 (×5): 1000 mg via ORAL
  Filled 2019-06-14 (×6): qty 4

## 2019-06-14 MED ORDER — MIDAZOLAM HCL 2 MG/2ML IJ SOLN: 1.0000 mg | Freq: Four times a day (QID) | INTRAMUSCULAR | Status: DC | PRN

## 2019-06-14 NOTE — Progress Notes (Addendum)
      [address]       [place] [address]             [phone]       2 Days Post-Op Procedure(s) (LRB): PARTIAL STERNOTOMY (N/A) THYMECTOMY (N/A)  Subjective: Patient with pain that worsens in the am.  Objective: Vital signs in last 24 hours: Temp:  [98.6 F (37 C)-99 F (37.2 C)] 98.8 F (37.1 C) (07/16 0415) Pulse Rate:  [67-121] 75 (07/16 0415) Cardiac Rhythm: Normal sinus rhythm (07/16 0447) Resp:  [7-34] 24 (07/16 0415) BP: (108-139)/(69-96) 127/96 (07/16 0415) SpO2:  [96 %-100 %] 99 % (07/16 0415)      Intake/Output from previous day: 07/15 0701 - 07/16 0700 In: 821.6 [P.O.:720; I.V.:101.6] Out: 1120 [Urine:1100; Chest Tube:20]   Physical Exam:  Cardiovascular: RRR Pulmonary: Mostly clear Abdomen: Soft, non tender, bowel sounds present. Extremities: Mild bilateral lower extremity edema. Wounds: Clean and dry.  No erythema or signs of infection. Chest Tubes:Mediastinal chest tube to water seal, no air leak;right pigtail chest tube to suction, no air leak  Lab Results: CBC: Recent Labs    06/13/19 0405 06/13/19 0412 06/14/19 0241  WBC 7.8  --  6.7  HGB 12.4* 12.9* 12.3*  HCT 39.6 38.0* 39.6  PLT 158  --  160   BMET:  Recent Labs    06/13/19 0405 06/13/19 0412 06/14/19 0241  NA 138 140 139  K 4.0 4.1 3.8  CL 106  --  103  CO2 25  --  28  GLUCOSE 103*  --  108*  BUN 8  --  13  CREATININE 1.13  --  1.13  CALCIUM 9.1  --  9.1    PT/INR: No results for input(s): LABPROT, INR in the last 72 hours. ABG:  INR: Will add last result for INR, ABG once components are confirmed Will add last 4 CBG results once components are confirmed  Assessment/Plan:  1. CV - SR in the 70's this am. 2.  Pulmonary - On room air. Chest tube with 20 cc last 24 hours. Mediastinal chest tube  is to water seal and there is no air leak. Right pigtail chest tube is to suction and no air leak. Hope to remove mediastinal and place right pigtail to  water seal soon. CXR this am shows decreased right apical pneumothorax;otherwise, stable. Await final pathology. 3. Supplement potassium 4. Will give a few doses of Toradol to help with pain 5. Myasthenia gravis-Was given a few doses of IV Solu Medrol and Prednisone to be started today. Will discuss with Dr. Prescott Gum when to start [name]  Donielle M ZimmermanPA-C 06/14/2019,7:07 AM [phone] No bulbar weakness Remove mediastinal drain Pigtail to water seal Start po [name] today- antimetabolite for myasthenia Having runs of SVT- will give short term toprol XL  patient examined and medical record reviewed,agree with above note. Tharon Aquas Trigt III 06/14/2019

## 2019-06-15 ENCOUNTER — Inpatient Hospital Stay (HOSPITAL_COMMUNITY): Payer: BC Managed Care – PPO

## 2019-06-15 LAB — CBC
HCT: 39.4 % (ref 39.0–52.0)
Hemoglobin: 12.3 g/dL — ABNORMAL LOW (ref 13.0–17.0)
MCH: 28.1 pg (ref 26.0–34.0)
MCHC: 31.2 g/dL (ref 30.0–36.0)
MCV: 90.2 fL (ref 80.0–100.0)
Platelets: 152 10*3/uL (ref 150–400)
RBC: 4.37 MIL/uL (ref 4.22–5.81)
RDW: 13.4 % (ref 11.5–15.5)
WBC: 6.2 10*3/uL (ref 4.0–10.5)
nRBC: 0 % (ref 0.0–0.2)

## 2019-06-15 LAB — BASIC METABOLIC PANEL
Anion gap: 8 (ref 5–15)
BUN: 14 mg/dL (ref 6–20)
CO2: 27 mmol/L (ref 22–32)
Calcium: 9.1 mg/dL (ref 8.9–10.3)
Chloride: 102 mmol/L (ref 98–111)
Creatinine, Ser: 1.13 mg/dL (ref 0.61–1.24)
GFR calc Af Amer: >60 mL/min (ref >60)
GFR calc non Af Amer: >60 mL/min (ref >60)
Glucose, Bld: 89 mg/dL (ref 70–99)
Potassium: 4 mmol/L (ref 3.5–5.1)
Sodium: 137 mmol/L (ref 135–145)

## 2019-06-15 NOTE — Progress Notes (Addendum)
      [address]       [place] [address]             [phone]       3 Days Post-Op Procedure(s) (LRB): PARTIAL STERNOTOMY (N/A) THYMECTOMY (N/A)  Subjective: Patient without specific complaints this am. He hopes right chest tube is removed today.  Objective: Vital signs in last 24 hours: Temp:  [98.4 F (36.9 C)-99.3 F (37.4 C)] 98.7 F (37.1 C) (07/17 0733) Pulse Rate:  [66-94] 74 (07/17 0733) Cardiac Rhythm: Normal sinus rhythm (07/17 0733) Resp:  [11-22] 20 (07/17 0400) BP: (106-133)/(77-90) 106/77 (07/17 0400) SpO2:  [94 %-100 %] 97 % (07/17 0733)     Intake/Output from previous day: 07/16 0701 - 07/17 0700 In: 240 [P.O.:240] Out: 0    Physical Exam:  Cardiovascular: RRR Pulmonary: Mostly clear Abdomen: Soft, non tender, bowel sounds present. Extremities: Mild bilateral lower extremity edema. Wounds: Aquacel removed and sternal wound has scant bloody drainage in 2 places.  No erythema or signs of infection. Mediastinal chest tube wound with sero sanguinous drainage. Chest Tubes:Right pigtail chest tube to water seal, no air leak with and without cough  Lab Results: CBC: Recent Labs    06/14/19 0241 06/15/19 0211  WBC 6.7 6.2  HGB 12.3* 12.3*  HCT 39.6 39.4  PLT 160 152   BMET:  Recent Labs    06/14/19 0241 06/15/19 0211  NA 139 137  K 3.8 4.0  CL 103 102  CO2 28 27  GLUCOSE 108* 89  BUN 13 14  CREATININE 1.13 1.13  CALCIUM 9.1 9.1    PT/INR: No results for input(s): LABPROT, INR in the last 72 hours. ABG:  INR: Will add last result for INR, ABG once components are confirmed Will add last 4 CBG results once components are confirmed  Assessment/Plan:  1. CV - Had short runs of SVT yesterday so put on Toprol XL 12.5 mg daily. SR in the 70-80's this am. 2.  Pulmonary - On room air. Chest tube with little to no output last 24 hours. Right pigtail chest tube is to water seal and no air leak. As discussed with Dr. 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 - NO EVIDENCE OF MALIGNANCY. 3. Myasthenia gravis-On Prednisone and [name] as taken prior to surgery. Per Dr. Prescott Gum, Prednisone dose 20 mg daily. 4. Hopefully, home in am if CXR stable  Donielle M ZimmermanPA-C 06/15/2019,7:37 AM [phone] DC pigtail, CXR in am patient examined and medical record reviewed,agree with above note. Tharon Aquas Trigt III 06/15/2019

## 2019-06-15 NOTE — Progress Notes (Signed)
AMBULATED ALONG THE HALLWAY ABOUT 600 FT. TOLERATED WELL.

## 2019-06-16 ENCOUNTER — Inpatient Hospital Stay (HOSPITAL_COMMUNITY): Payer: BC Managed Care – PPO

## 2019-06-16 MED ORDER — PREDNISONE 20 MG PO TABS: ORAL_TABLET | ORAL | Status: DC

## 2019-06-16 MED ORDER — OXYCODONE HCL 5 MG PO TABS: 5.0000 mg | ORAL_TABLET | Freq: Four times a day (QID) | ORAL | 0 refills | Status: AC | PRN

## 2019-06-16 MED ORDER — MYCOPHENOLATE MOFETIL 500 MG PO TABS: 1000.0000 mg | ORAL_TABLET | Freq: Two times a day (BID) | ORAL | Status: DC

## 2019-06-16 NOTE — Plan of Care (Signed)

## 2019-06-16 NOTE — Plan of Care (Signed)

## 2019-06-16 NOTE — Progress Notes (Signed)
Pt got discharged to home, discharge instructions provided and patient showed understanding to it, IV taken out,Telemonitor DC,pt left unit in wheelchair with all of the belongings accompanied with a family member.  Palma Holter RN

## 2019-06-16 NOTE — Progress Notes (Addendum)
[address]       [place] [address]             [phone]      4 Days Post-Op Procedure(s) (LRB): PARTIAL STERNOTOMY (N/A) THYMECTOMY (N/A) Subjective: Looks and feels well, no new issues   Objective: Vital signs in last 24 hours: Temp:  [98.4 F (36.9 C)-98.9 F (37.2 C)] 98.6 F (37 C) (07/18 0740) Pulse Rate:  [57-92] 91 (07/18 0740) Cardiac Rhythm: Normal sinus rhythm (07/18 0740) Resp:  [13-21] 21 (07/18 0740) BP: (108-123)/(72-85) 123/78 (07/18 0740) SpO2:  [96 %-100 %] 100 % (07/18 0740)  Hemodynamic parameters for last 24 hours:    Intake/Output from previous day: 07/17 0701 - 07/18 0700 In: 670 [P.O.:670] Out: 0  Intake/Output this shift: No intake/output data recorded.  General appearance: alert, cooperative and no distress Heart: regular rate and rhythm Lungs: clear to auscultation bilaterally and min dim in bases Abdomen: benign Extremities: no edema or calf tenderness Wound: incis healing well  Lab Results: Recent Labs    06/14/19 0241 06/15/19 0211  WBC 6.7 6.2  HGB 12.3* 12.3*  HCT 39.6 39.4  PLT 160 152   BMET:  Recent Labs    06/14/19 0241 06/15/19 0211  NA 139 137  K 3.8 4.0  CL 103 102  CO2 28 27  GLUCOSE 108* 89  BUN 13 14  CREATININE 1.13 1.13  CALCIUM 9.1 9.1    PT/INR: No results for input(s): LABPROT, INR in the last 72 hours. ABG    Component Value Date/Time   PHART 7.434 06/13/2019 0412   HCO3 28.0 06/13/2019 0412   TCO2 29 06/13/2019 0412   O2SAT 97.0 06/13/2019 0412   CBG (last 3)  Recent Labs    06/13/19 1218  GLUCAP 155*    Meds Scheduled Meds: . acetaminophen  1,000 mg Oral Q6H   Or  . acetaminophen (TYLENOL) oral liquid 160 mg/5 mL  1,000 mg Oral Q6H  . bisacodyl  10 mg Oral Daily  . Chlorhexidine Gluconate Cloth  6 each Topical Daily  . ketorolac  15 mg Intravenous Q6H  . mycophenolate  1,000 mg Oral BID  . predniSONE  20 mg Oral QAC breakfast  . timolol  1 drop  Both Eyes BID   Continuous Infusions: . sodium chloride Stopped (06/13/19 1122)   PRN Meds:.sodium chloride, fentaNYL (SUBLIMAZE) injection, hydrALAZINE, midazolam, ondansetron (ZOFRAN) IV, oxyCODONE, traMADol  Xrays Dg Chest 1 View  Result Date: 06/15/2019 CLINICAL DATA:  Follow-up chest tube EXAM: CHEST  1 VIEW COMPARISON:  06/14/2019 FINDINGS: Pigtail catheter is again noted on the right. A tiny apical pneumothorax is noted stable in appearance from the prior exam. The lungs are otherwise clear. Postsurgical changes are again noted. Cardiac shadow is stable. IMPRESSION: Tiny right-sided pneumothorax similar to that seen on previous exam. Electronically Signed   By: Inez Catalina M.D.   On: 06/15/2019 09:42   Dg Chest 2 View  Result Date: 06/16/2019 CLINICAL DATA:  Follow-up pneumothorax EXAM: CHEST - 2 VIEW COMPARISON:  06/15/2019 FINDINGS: Tiny amount of pleural air remains visible at the right apex. No increase. The lungs are otherwise clear. Previous median sternotomy. Normal vascularity. IMPRESSION: Tiny amount of residual pleural air at the right apex, not increased. Electronically Signed   By: Nelson Chimes M.D.   On: 06/16/2019 08:39   Dg Chest Port 1 View  Result Date: 06/15/2019 CLINICAL DATA:  Status post chest tube removal EXAM:  PORTABLE CHEST 1 VIEW COMPARISON:  Film from earlier in the same day. FINDINGS: Right-sided chest tube has been removed. Tiny pneumothorax remains although decreased in size when compared with the prior exam. No new focal abnormality is seen. Postsurgical changes are noted. IMPRESSION: Minimal residual pneumothorax following chest tube removal. Electronically Signed   By: Inez Catalina M.D.   On: 06/15/2019 11:23    Assessment/Plan: S/P Procedure(s) (LRB): PARTIAL STERNOTOMY (N/A) THYMECTOMY (N/A)  1 conts to do well 2 some sinus tachy at times but o/w hemodyn stable , no fevers 3 sats good on RA 4 no new labs 5 stable for discharge Patient has an  acute prednisone taper per neurologist instructions that he knows to follow  LOS: 4 days    John Giovanni [facility] 06/16/2019 Pager [phone]  Agree with above Home today

## 2019-06-27 ENCOUNTER — Ambulatory Visit (INDEPENDENT_AMBULATORY_CARE_PROVIDER_SITE_OTHER): Payer: Self-pay | Admitting: Cardiothoracic Surgery

## 2019-06-27 ENCOUNTER — Other Ambulatory Visit: Payer: Self-pay | Admitting: Cardiothoracic Surgery

## 2019-06-27 ENCOUNTER — Encounter: Payer: Self-pay | Admitting: Cardiothoracic Surgery

## 2019-06-27 ENCOUNTER — Other Ambulatory Visit: Payer: Self-pay

## 2019-06-27 ENCOUNTER — Ambulatory Visit
Admission: RE | Admit: 2019-06-27 | Discharge: 2019-06-27 | Disposition: A | Discharge status: Home / Self Care | Payer: BC Managed Care – PPO | Source: Ambulatory Visit | Attending: Cardiothoracic Surgery | Admitting: Cardiothoracic Surgery

## 2019-06-27 VITALS — BP 128/83 | HR 70 | Temp 97.7°F | Resp 20 | Ht 68.0 in | Wt 142.0 lb

## 2019-06-27 DIAGNOSIS — G7000 Myasthenia gravis without (acute) exacerbation: Secondary | ICD-10-CM

## 2019-06-27 DIAGNOSIS — Z9089 Acquired absence of other organs: Secondary | ICD-10-CM

## 2019-06-27 MED ORDER — CYCLOBENZAPRINE HCL 10 MG PO TABS: 10.0000 mg | ORAL_TABLET | Freq: Three times a day (TID) | ORAL | 0 refills | Status: DC | PRN

## 2019-06-27 NOTE — Progress Notes (Signed)
flexeril

## 2019-06-27 NOTE — Progress Notes (Signed)
PCP is Elby Beck, FNP Referring Provider is [name], MD  Chief Complaint  Patient presents with  . Routine Post Op    f/u from surgery with CXR s/p Partial sternotomy for thymectomy 06/12/19    HPI: Post postop visit for thymectomy via partial sternotomy 3 weeks ago. Chest x-ray today is clear.  Sternal incision is healing well.  Chest tube sutures removed. No symptoms of myasthenia, bulbar weakness or ptosis Prednisone being tapered by the patient's neurologist. Path showed thymic hyperplasia. Past Medical History:  Diagnosis Date  . Myasthenia gravis [facility])     Past Surgical History:  Procedure Laterality Date  . eyelid surgery Right    as a teenager  . STERNOTOMY N/A 06/12/2019   Procedure: PARTIAL STERNOTOMY;  Surgeon: Ivin Poot, MD;  Location: [place];  Service: Thoracic;  Laterality: N/A;  . THYMECTOMY N/A 06/12/2019   Procedure: THYMECTOMY;  Surgeon: Ivin Poot, MD;  Location: [facility] OR;  Service: Thoracic;  Laterality: N/A;    Family History  Problem Relation Age of Onset  . Cancer Mother   . Thyroid disease Father   . Thyroid disease Brother     Social History Social History   Tobacco Use  . Smoking status: Never Smoker  . Smokeless tobacco: Never Used  Substance Use Topics  . Alcohol use: Yes    Alcohol/week: 2.0 standard drinks    Types: 2 Shots of liquor per week  . Drug use: No    Current Outpatient Medications  Medication Sig Dispense Refill  . mycophenolate ([name]) 500 MG tablet Take 2 tablets (1,000 mg total) by mouth 2 (two) times daily.    . Nutritional Supplements (PROTEIN SUPPLEMENT 80% PO) Take 1 Scoop by mouth 2 (two) times a day. MIXED WITH MILK    . predniSONE (DELTASONE) 20 MG tablet Taper as per neurologist instructions    . timolol (BETIMOL) 0.25 % ophthalmic solution Place 1 drop into both eyes 2 (two) times daily.     . cyclobenzaprine (FLEXERIL) 10 MG tablet Take 1 tablet (10 mg total) by mouth 3 (three)  times daily as needed for muscle spasms. 30 tablet 0   No current facility-administered medications for this visit.     No Known Allergies  Review of Systems complains of soreness in his upper back and stiffness  BP 128/83   Pulse 70   Temp 97.7 F (36.5 C) (Skin)   Resp 20   Ht 5\' 8"  (1.727 m)   Wt 142 lb (64.4 kg)   SpO2 97% Comment: RA  BMI 21.59 kg/m  Physical Exam      Exam    General- alert and comfortable    Neck- no JVD, no cervical adenopathy palpable, no carotid bruit   Lungs- clear without rales, wheezes   Cor- regular rate and rhythm, no murmur , gallop   Abdomen- soft, non-tender   Extremities - warm, non-tender, minimal edema   Neuro- oriented, appropriate, no focal weakness  Diagnostic Tests: Chest x-ray clear  Impression: Doing well after partial sternotomy and thymectomy for myasthenia gravis  Plan: Prescription for Flexeril provided patient for upper back stiffness and soreness.  Patient knows not to lift more than 10 pounds until he returns for his next visit in 6 weeks.  Sternal healing will be somewhat inhibited by his steroids.   [name], MD [facility] [phone]

## 2019-07-11 ENCOUNTER — Encounter: Payer: Self-pay | Admitting: Cardiothoracic Surgery

## 2019-07-23 NOTE — Telephone Encounter (Addendum)
Received orders from [facility] for continued IVIG Panzyga 70 grams total IV dose given in divided doses over 1-2 days every 4 weeks via PIV. Orders signed by Dr. Jaynee Eagles and faxed back to [place]. Received a receipt of confirmation.

## 2019-08-08 ENCOUNTER — Ambulatory Visit: Payer: BC Managed Care – PPO | Admitting: Cardiothoracic Surgery

## 2019-08-09 ENCOUNTER — Ambulatory Visit: Payer: BC Managed Care – PPO | Admitting: Cardiothoracic Surgery

## 2019-08-19 ENCOUNTER — Other Ambulatory Visit: Payer: Self-pay | Admitting: Neurology

## 2019-08-19 DIAGNOSIS — G7000 Myasthenia gravis without (acute) exacerbation: Secondary | ICD-10-CM

## 2019-08-19 MED ORDER — PREDNISONE 5 MG PO TABS: 10.0000 mg | ORAL_TABLET | Freq: Every day | ORAL | 1 refills | Status: DC

## 2019-08-22 ENCOUNTER — Encounter: Payer: Self-pay | Admitting: Cardiothoracic Surgery

## 2019-08-22 ENCOUNTER — Telehealth: Payer: Self-pay | Admitting: *Deleted

## 2019-08-22 ENCOUNTER — Other Ambulatory Visit: Payer: Self-pay

## 2019-08-22 ENCOUNTER — Ambulatory Visit (INDEPENDENT_AMBULATORY_CARE_PROVIDER_SITE_OTHER): Payer: Self-pay | Admitting: Cardiothoracic Surgery

## 2019-08-22 VITALS — BP 110/70 | HR 76 | Temp 97.7°F | Ht 68.0 in | Wt 144.4 lb

## 2019-08-22 DIAGNOSIS — Z9089 Acquired absence of other organs: Secondary | ICD-10-CM

## 2019-08-22 DIAGNOSIS — G7000 Myasthenia gravis without (acute) exacerbation: Secondary | ICD-10-CM

## 2019-08-22 NOTE — Telephone Encounter (Signed)
We received a post-infusion report from [facility] re: 07/16/2019 IVIG infusion. Pt received Panzyga 70 grams, reported no s/e or adverse reactions, no access or infusion issues noted, and pt tolerated infusion well.

## 2019-08-22 NOTE — Progress Notes (Signed)
PCP is Elby Beck, FNP Referring Provide   r is [name], MD  Chief Complaint  Patient presents with  . Follow-up    6 wk f/u s/p THYMECTOMY 06/12/19    HPI: Patient returns for 65-month follow-up after sternotomy and thymectomy for myasthenia.  The sternal incision is healing well.  He is following a 10 pound weight limit for lifting objects above his shoulders.  He is teaching grade school online.  He noticed some weakness in his right eye movement and is been placed back on low-dose prednisone by his neurologist.  He is still receiving IVIG therapy as well.  He denies shortness of breath or swallowing difficulties.  He is having only minimal post sternotomy discomfort.  No clicking or popping sensation in his sternal wound.   Past Medical History:  Diagnosis Date  . Myasthenia gravis [facility])     Past Surgical History:  Procedure Laterality Date  . eyelid surgery Right    as a teenager  . STERNOTOMY N/A 06/12/2019   Procedure: PARTIAL STERNOTOMY;  Surgeon: Ivin Poot, MD;  Location: [place];  Service: Thoracic;  Laterality: N/A;  . THYMECTOMY N/A 06/12/2019   Procedure: THYMECTOMY;  Surgeon: Ivin Poot, MD;  Location: [facility] OR;  Service: Thoracic;  Laterality: N/A;    Family History  Problem Relation Age of Onset  . Cancer Mother   . Thyroid disease Father   . Thyroid disease Brother     Social History Social History   Tobacco Use  . Smoking status: Never Smoker  . Smokeless tobacco: Never Used  Substance Use Topics  . Alcohol use: Yes    Alcohol/week: 2.0 standard drinks    Types: 2 Shots of liquor per week  . Drug use: No    Current Outpatient Medications  Medication Sig Dispense Refill  . cyclobenzaprine (FLEXERIL) 10 MG tablet Take 1 tablet (10 mg total) by mouth 3 (three) times daily as needed for muscle spasms. 30 tablet 0  . mycophenolate ([name]) 500 MG tablet Take 2 tablets (1,000 mg total) by mouth 2 (two) times daily.    .  Nutritional Supplements (PROTEIN SUPPLEMENT 80% PO) Take 1 Scoop by mouth 2 (two) times a day. MIXED WITH MILK    . predniSONE (DELTASONE) 5 MG tablet Take 2 tablets (10 mg total) by mouth daily with breakfast. 60 tablet 1  . timolol (BETIMOL) 0.25 % ophthalmic solution Place 1 drop into both eyes 2 (two) times daily.      No current facility-administered medications for this visit.     No Known Allergies  Review of Systems   Patient getting stronger.  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  BP 110/70 (BP Location: Right Arm, Patient Position: Sitting, Cuff Size: Normal)   Pulse 76   Temp 97.7 F (36.5 C)   Ht 5\' 8"  (1.727 m)   Wt 144 lb 6.4 oz (65.5 kg)   SpO2 96% Comment: RA  BMI 21.96 kg/m  Physical Exam Alert comfortable Some weakness in the right teye with medial accommodation when he looks to his left Sternum stable well-healed Lungs clear Heart rate regular murmur No peripheral edema   Diagnostic Tests:  No chest x-ray today Impression: Recovering from sternotomy and thymectomy, still not fully recovered.  He should not lift more than 10 pounds until mid October.  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  Plan: Continue lifting restrictions.  Return in 4 weeks for review of progress.   [name], MD [facility] [phone]

## 2019-08-30 ENCOUNTER — Telehealth: Payer: Self-pay | Admitting: *Deleted

## 2019-08-30 NOTE — Telephone Encounter (Signed)
Received a call from [facility] in [place] regarding Mycophenolate. Spoke with Jeneen Rinks the pharmacist. He stated pt had one refill left. Last refill 08/05/2019 original prescription 04/03/2019 from Dr. Jaynee Eagles. I advised I would d/w Dr. Jaynee Eagles before authorizing further refills. He verbalized appreciation.

## 2019-08-31 ENCOUNTER — Other Ambulatory Visit: Payer: Self-pay | Admitting: Neurology

## 2019-08-31 ENCOUNTER — Telehealth: Payer: Self-pay | Admitting: Neurology

## 2019-08-31 DIAGNOSIS — Z79899 Other long term (current) drug therapy: Secondary | ICD-10-CM

## 2019-08-31 MED ORDER — MYCOPHENOLATE MOFETIL 500 MG PO TABS: 1000.0000 mg | ORAL_TABLET | Freq: Two times a day (BID) | ORAL | 3 refills | Status: DC

## 2019-08-31 NOTE — Telephone Encounter (Signed)
I took acre of this fyi thanks

## 2019-08-31 NOTE — Telephone Encounter (Signed)
Linda from [place] called wanting clarification and a refill on the instructions of the pt's mycophenolate ([name]) 500 MG tablet

## 2019-08-31 NOTE — Telephone Encounter (Signed)
That is fine to refill for 3 months bc he is still symptomatic. Also let him know he needs to come for bloodowrk, I have ordered the bloodwork he just needs to come to the office thanks

## 2019-09-17 ENCOUNTER — Telehealth: Payer: Self-pay | Admitting: *Deleted

## 2019-09-17 NOTE — Telephone Encounter (Signed)
Received orders from [facility] for continued IVIG Panzyga 70 grams total IV dose given in divided doses over 1-2 days every 4 weeks via PIV. Orders signed by Dr. Jaynee Eagles and faxed back to [facility]. Received a receipt of confirmation.

## 2019-09-19 ENCOUNTER — Ambulatory Visit (INDEPENDENT_AMBULATORY_CARE_PROVIDER_SITE_OTHER): Payer: BC Managed Care – PPO | Admitting: Neurology

## 2019-09-19 ENCOUNTER — Ambulatory Visit: Payer: BC Managed Care – PPO | Admitting: Cardiothoracic Surgery

## 2019-09-19 ENCOUNTER — Encounter: Payer: Self-pay | Admitting: Neurology

## 2019-09-19 ENCOUNTER — Other Ambulatory Visit: Payer: Self-pay

## 2019-09-19 ENCOUNTER — Encounter: Payer: Self-pay | Admitting: Cardiothoracic Surgery

## 2019-09-19 VITALS — BP 120/75 | HR 84 | Temp 97.8°F | Ht 68.0 in | Wt 150.0 lb

## 2019-09-19 VITALS — BP 123/88 | HR 90 | Temp 97.8°F | Resp 20 | Ht 68.0 in | Wt 149.0 lb

## 2019-09-19 DIAGNOSIS — G7000 Myasthenia gravis without (acute) exacerbation: Secondary | ICD-10-CM | POA: Diagnosis not present

## 2019-09-19 DIAGNOSIS — Z9089 Acquired absence of other organs: Secondary | ICD-10-CM

## 2019-09-19 DIAGNOSIS — Z79899 Other long term (current) drug therapy: Secondary | ICD-10-CM

## 2019-09-19 MED ORDER — PREDNISONE 10 MG PO TABS: 40.0000 mg | ORAL_TABLET | Freq: Every day | ORAL | 3 refills | Status: DC

## 2019-09-19 NOTE — Patient Instructions (Signed)
40mg  a day January 30mg  a day February 25mg  a day March 20mg  a day April 15mg  a day May 10mg  a day June 7.5mg  a day July 5 mg a day Aug 2.5mg  a day  Prednisone tablets What is this medicine? PREDNISONE (PRED ni sone) is a corticosteroid. It is commonly used to treat inflammation of the skin, joints, lungs, and other organs. Common conditions treated include asthma, allergies, and arthritis. It is also used for other conditions, such as blood disorders and diseases of the adrenal glands. This medicine may be used for other purposes; ask your health care provider or pharmacist if you have questions. COMMON BRAND NAME(S): Deltasone, Predone, Sterapred, Sterapred DS What should I tell my health care provider before I take this medicine? They need to know if you have any of these conditions:  Cushing's syndrome  diabetes  glaucoma  heart disease  high blood pressure  infection (especially a virus infection such as chickenpox, cold sores, or herpes)  kidney disease  liver disease  mental illness  myasthenia gravis  osteoporosis  seizures  stomach or intestine problems  thyroid disease  an unusual or allergic reaction to lactose, prednisone, other medicines, foods, dyes, or preservatives  pregnant or trying to get pregnant  breast-feeding How should I use this medicine? Take this medicine by mouth with a glass of water. Follow the directions on the prescription label. Take this medicine with food. If you are taking this medicine once a day, take it in the morning. Do not take more medicine than you are told to take. Do not suddenly stop taking your medicine because you may develop a severe reaction. Your doctor will tell you how much medicine to take. If your doctor wants you to stop the medicine, the dose may be slowly lowered over time to avoid any side effects. Talk to your pediatrician regarding the use of this medicine in children. Special care may be  needed. Overdosage: If you think you have taken too much of this medicine contact a poison control center or emergency room at once. NOTE: This medicine is only for you. Do not share this medicine with others. What if I miss a dose? If you miss a dose, take it as soon as you can. If it is almost time for your next dose, talk to your doctor or health care professional. You may need to miss a dose or take an extra dose. Do not take double or extra doses without advice. What may interact with this medicine? Do not take this medicine with any of the following medications:  metyrapone  mifepristone This medicine may also interact with the following medications:  aminoglutethimide  amphotericin B  aspirin and aspirin-like medicines  barbiturates  certain medicines for diabetes, like glipizide or glyburide  cholestyramine  cholinesterase inhibitors  cyclosporine  digoxin  diuretics  ephedrine  male hormones, like estrogens and birth control pills  isoniazid  ketoconazole  NSAIDS, medicines for pain and inflammation, like ibuprofen or naproxen  phenytoin  rifampin  toxoids  vaccines  warfarin This list may not describe all possible interactions. Give your health care provider a list of all the medicines, herbs, non-prescription drugs, or dietary supplements you use. Also tell them if you smoke, drink alcohol, or use illegal drugs. Some items may interact with your medicine. What should I watch for while using this medicine? Visit your doctor or health care professional for regular checks on your progress. If you are taking this medicine over a prolonged  period, carry an identification card with your name and address, the type and dose of your medicine, and your doctor's name and address. This medicine may increase your risk of getting an infection. Tell your doctor or health care professional if you are around anyone with measles or chickenpox, or if you develop sores  or blisters that do not heal properly. If you are going to have surgery, tell your doctor or health care professional that you have taken this medicine within the last twelve months. Ask your doctor or health care professional about your diet. You may need to lower the amount of salt you eat. This medicine may increase blood sugar. Ask your healthcare provider if changes in diet or medicines are needed if you have diabetes. What side effects may I notice from receiving this medicine? Side effects that you should report to your doctor or health care professional as soon as possible:  allergic reactions like skin rash, itching or hives, swelling of the face, lips, or tongue  changes in emotions or moods  changes in vision  depressed mood  eye pain  fever or chills, cough, sore throat, pain or difficulty passing urine  signs and symptoms of high blood sugar such as being more thirsty or hungry or having to urinate more than normal. You may also feel very tired or have blurry vision.  swelling of ankles, feet Side effects that usually do not require medical attention (report to your doctor or health care professional if they continue or are bothersome):  confusion, excitement, restlessness  headache  nausea, vomiting  skin problems, acne, thin and shiny skin  trouble sleeping  weight gain This list may not describe all possible side effects. Call your doctor for medical advice about side effects. You may report side effects to FDA at 1-800-FDA-1088. Where should I keep my medicine? Keep out of the reach of children. Store at room temperature between 15 and 30 degrees C (59 and 86 degrees F). Protect from light. Keep container tightly closed. Throw away any unused medicine after the expiration date. NOTE: This sheet is a summary. It may not cover all possible information. If you have questions about this medicine, talk to your doctor, pharmacist, or health care provider.  2020  [employer]vier/Gold Standard (2018-08-15 10:54:22)

## 2019-09-19 NOTE — Progress Notes (Signed)
PCP is Elby Beck, FNP Referring Provider is [name], MD  Chief Complaint  Patient presents with  . Routine Post Op    4 week f/u    HPI: 3 months status post sternotomy and thymectomy for myasthenia gravis.  The specimen was 35 g in weight, 7 cm x 5 cm.  Incision is healed.  Chest x-ray is clear.  Patient is in the process of weaning his immunosuppressive drugs with his neurologist.  He now has no restrictions on his activity or lifting.   Past Medical History:  Diagnosis Date  . Myasthenia gravis [facility])     Past Surgical History:  Procedure Laterality Date  . eyelid surgery Right    as a teenager  . STERNOTOMY N/A 06/12/2019   Procedure: PARTIAL STERNOTOMY;  Surgeon: Ivin Poot, MD;  Location: [facility];  Service: Thoracic;  Laterality: N/A;  . THYMECTOMY N/A 06/12/2019   Procedure: THYMECTOMY;  Surgeon: Ivin Poot, MD;  Location: [facility] OR;  Service: Thoracic;  Laterality: N/A;    Family History  Problem Relation Age of Onset  . Cancer Mother   . Thyroid disease Father   . Thyroid disease Brother     Social History Social History   Tobacco Use  . Smoking status: Never Smoker  . Smokeless tobacco: Never Used  Substance Use Topics  . Alcohol use: Yes    Alcohol/week: 2.0 standard drinks    Types: 2 Shots of liquor per week  . Drug use: No    Current Outpatient Medications  Medication Sig Dispense Refill  . mycophenolate ([name]) 500 MG tablet Take 2 tablets (1,000 mg total) by mouth 2 (two) times daily. 60 tablet 3  . Nutritional Supplements (PROTEIN SUPPLEMENT 80% PO) Take 1 Scoop by mouth 2 (two) times a day. MIXED WITH MILK    . predniSONE (DELTASONE) 10 MG tablet Take 4 tablets (40 mg total) by mouth daily with breakfast. 360 tablet 3  . timolol (BETIMOL) 0.25 % ophthalmic solution Place 1 drop into both eyes 2 (two) times daily.      No current facility-administered medications for this visit.     No Known Allergies  Review of  Systems  Feels well Some extraocular muscular dysfunction but no general weakness, no shortness of breath  BP 123/88   Pulse 90   Temp 97.8 F (36.6 C) (Skin)   Resp 20   Ht 5\' 8"  (1.727 m)   Wt 149 lb (67.6 kg)   SpO2 97% Comment: RA  BMI 22.66 kg/m  Physical Exam      Exam    General- alert and comfortable    Neck- no JVD, no cervical adenopathy palpable, no carotid bruit   Lungs- clear without rales, wheezes   Cor- regular rate and rhythm, no murmur , gallop   Abdomen- soft, non-tender   Extremities - warm, non-tender, minimal edema   Neuro- oriented, appropriate, no focal weakness   Diagnostic Tests: Last chest x-ray clear  Impression: Patient completed recovery from sternotomy and thymectomy He is cleared to return to work No physical restrictions at this point Plan: Return as needed  [name], MD [facility] [phone]

## 2019-09-19 NOTE — Telephone Encounter (Signed)
We received a post-infusion report from [facility] re: 08/14/2019 IVIG infusion. Pt received Panzyga 70 grams, reported no s/e or adverse reactions, no access or infusion issues noted, and pt tolerated infusion well.

## 2019-09-19 NOTE — Progress Notes (Signed)
[facility]    Provider:  Dr Jaynee Eagles Referring Provider: Elby Beck, FNP Primary Care Physician:  Elby Beck, FNP  CC:  Myasthenia gravis  interval history 09/20/2019: 33 year old with Seropositive myasthenia gravis( Ach: Receptor binding antibodies were positive at 0.92). He started [name] 06/2017 100mg  bid and was increased to 1500mg  bid 07/2018. Dose had to be adjusted due to low WBCs. Was on IVIG prior to thymectomy. 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 The thymectomy can take up to a year to show results, will keep on current dose of cellcept and taper steroids.  Examined patient 04/27/2019 and emailed with patient over the weekend.  Patient with worsening ptosis, ophthalmoplegia, significant diplopia and impairment of vision since decreasing [name] due to low white blood cells, cannot start another immunosuppressant due to his neutropenia,, I have advised patient not to drive, I have increased his steroids unfortunately steroids can take 6 to 12 weeks to work and we cannot keep them for longer than 2 to 3 weeks because we hope you will have his thymus removed and steroids impair healing, need IVIG for myasthenia gravis crisis.  Examined patient's EOM this morning when he came for labs 04/04/2019, impaired right eye adduction. Took a trough level of cellcept today if he is a fast metabolizer and level not therapeutic may consider increasing doseage of cellcept. 04/04/2019.   Interval history 03/29/2019:  33 year old with Seropositive myasthenia gravis( Ach: Receptor binding antibodies were positive at 0.92). He started [name] 06/2017 100mg  bid and was increased to 1500mg  bid 07/2018. He is on 5mg  of prednisone daily. He feels well except he reports his right eye still does no have full EOM and he cannot fully abduct. He feels he was much better when he was on higher doses of steroids. The cellcept has helped tremendously with his diplopia and ptosis and any  generalized symptoms but still reports decreased EOM of the right eye which is bothering him. He does want to get off of the steroids, at this time will decrease to 5mg  every other day but if symptoms worsen will return to 5mg  daily.   Interval history 08/03/2018: He has not been here for over a year and not returned our calls. He has been well. He has experienced more opthalmoplegia when off of the prednisone but at 40mg  with the cellcept he does well. Will increase cellcept. Discussed we really need to decrease his steroids long-term at this dose (or any dose) can cause significant sequelae. Discussed he should be on calcium and vitamin D. Also needs repeat CT chest and eval to cardiothoracic for removal of thymic tissue. Needs labs today and again in 3-4 months. And he needs to be more compliant with his appointments, he has not been here in over a year and has not returned multiple of our calls.  Interval history 05/18/2017: Seropositive myasthenia gravis. Reviewed MRI of the brain with patient, reviewed images which were unremarkable. CT of the chest did not show thymoma or thymus tumor but discussed that there is evidence that removing any thymic tissue improves outcomes in myasthenia gravis. Patient would like to be seen by cardiothoracic surgeon. Provided literature in myasthenia gravis and thymectomy. Patient started on 40 mg of steroids. He feels improved, his strength is better, getting more repetitions in the gym, the ptosis is minimally better. The mestinon helps with the diplopia. He only takes a 1/2 pill didn't like a whole pill of mestinon. But he feels improved. Had a long  discussion about seropositive myasthenia gravis,   HPI:  Eddie [name] is a 33 y.o. male here as a referral from Dr. Carlean Purl for myasthenia gravis. Patient reported diplopia and vision changes starting in February. 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 He was  instructed to have thyroid tests as well as myasthenia gravis panel checked. 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 Ach: Receptor binding antibodies were positive at 0.92. Back in February he went on a ski trip and towards the end his eyes started feeling weak with trouble focusing. Father had a thyroid issue so he had testing and tsh was normal. He woke up one night nd looked at the clock on the cable box and saw 2 different times. Dr. Manuella Ghazi diagnosed him with thyrotoxicosis and then he was told he had myasthenia gravis. His right eye wanders now. He is having difficulty lifting his arms and working out, he can;t weight lift as well as he used to. No issues swallowing but he has loss of strength. The double vision is worse in the morning and bad all day. Not better after a nap is worse. His eyes are misaligned. He has ptosis. No swallowing difficulty. No voice qualityor breathing difficulties. Feels like it is progressing. Strong family history of autoimmune disease.  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 Symptoms have gotten worse since then. He went to see an ophthalmologist who diagnosed him with thyrotoxicosis and diffuse goiter, ocular muscle weakness. He was to hold have thyroid tests and myasthenia gravis panel checked. Labs showed thyrotropin receptor autoantibodies, thyroid stimulating immunoglobulin and he was referred to endocrinology. Also reviewed endocrinology diagnosed with Graves' disease.  Ach Receptor binding antibodies were positive.   Review of Systems: Patient complains of symptoms per HPI as well as the following symptoms: No chest pain, no rashes, no shortness of breath. Pertinent negatives and positives per HPI.  All others negative.    Social History   Socioeconomic History  . Marital status: Single    Spouse name: Not on file  . Number of children: 2  . Years of education: 55  . Highest education level: Not on file  Occupational History  . Occupation: [employer]  . Financial resource strain: Not on file  . Food insecurity    Worry: Not on file    Inability: Not on file  . Transportation needs    Medical: Not on file    Non-medical: Not on file  Tobacco Use  . Smoking status: Never Smoker  . Smokeless tobacco: Never Used  Substance and Sexual Activity  . Alcohol use: Yes    Alcohol/week: 2.0 standard drinks    Types: 2 Shots of liquor per week  . Drug use: No  . Sexual activity: Yes    Birth control/protection: Condom  Lifestyle  . Physical activity    Days per week: Not on file    Minutes per session: Not on file  . Stress: Not on file  Relationships  . Social Herbalist on phone: Not on file    Gets together: Not on file    Attends religious service: Not on file    Active member of club or organization:  Not on file    Attends meetings of clubs or organizations: Not on file    Relationship status: Not on file  . Intimate partner violence    Fear of current or ex partner: Not on file    Emotionally abused: Not on file    Physically abused: Not on file    Forced sexual activity: Not on file  Other Topics Concern  . Not on file  Social History Narrative   Lives at home alone   Caffeine: coffee 1 cup daily Monday-Friday   Right handed    Family History  Problem Relation Age of Onset  . Cancer Mother   . Thyroid disease Father   . Thyroid disease Brother     Past Medical History:  Diagnosis Date  . Myasthenia gravis [facility])     Past Surgical History:  Procedure Laterality Date  . eyelid surgery Right    as a teenager  . STERNOTOMY N/A 06/12/2019   Procedure: PARTIAL STERNOTOMY;  Surgeon: Ivin Poot, MD;  Location:  [place];  Service: Thoracic;  Laterality: N/A;  . THYMECTOMY N/A 06/12/2019   Procedure: THYMECTOMY;  Surgeon: Ivin Poot, MD;  Location: [facility] OR;  Service: Thoracic;  Laterality: N/A;    Current Outpatient Medications  Medication Sig Dispense Refill  . mycophenolate ([name]) 500 MG tablet Take 2 tablets (1,000 mg total) by mouth 2 (two) times daily. 60 tablet 3  . Nutritional Supplements (PROTEIN SUPPLEMENT 80% PO) Take 1 Scoop by mouth 2 (two) times a day. MIXED WITH MILK    . timolol (BETIMOL) 0.25 % ophthalmic solution Place 1 drop into both eyes 2 (two) times daily.     . predniSONE (DELTASONE) 10 MG tablet Take 4 tablets (40 mg total) by mouth daily with breakfast. 360 tablet 3   No current facility-administered medications for this visit.     Allergies as of 09/19/2019  . (No Known Allergies)    Vitals: BP 120/75 (BP Location: Right Arm, Patient Position: Sitting)   Pulse 84   Temp 97.8 F (36.6 C) Comment: taken at front door  Ht 5\' 8"  (1.727 m)   Wt 150 lb (68 kg)   BMI 22.81 kg/m  Last Weight:  Wt Readings from Last 1 Encounters:  09/19/19 149 lb (67.6 kg)   Last Height:   Ht Readings from Last 1 Encounters:  09/19/19 5\' 8"  (1.727 m)       Neuro: Detailed Neurologic Exam  Speech:    Speech is normal; fluent and spontaneous with normal comprehension.  Cognition:    The patient is oriented to person, place, and time;     recent and remote memory intact;     language fluent;     normal attention, concentration,     fund of knowledge Cranial Nerves:    The pupils are equal, round, and reactive to light. The fundi are normal and spontaneous venous pulsations are present. Visual fields are full to finger confrontation. Disconjugate gaze, Cannot abduct or addduct laterally or medially, can cross the midline on the right but not on left, fatiguable upgaze, Ptosis not present, Trigeminal sensation is intact and the muscles of mastication are normal. The face is  symmetric. The palate elevates in the midline. Hearing intact. Voice is normal. Shoulder shrug is normal. The tongue has normal motion without fasciculations.   Coordination:    Normal finger to nose and heel to shin. Normal rapid alternating movements.   Gait:    Heel-toe and  tandem gait are normal.   Motor Observation:    No asymmetry, no atrophy, and no involuntary movements noted. Tone:    Normal muscle tone.    Posture:    Posture is normal. normal erect    Strength:    Strength is V/V in the upper and lower limbs.      Sensation: intact to LT     Reflex Exam:  DTR's:    Deep tendon reflexes in the upper and lower extremities are normal bilaterally.   Toes:    The toes are downgoing bilaterally.   Clonus:    Clonus is absent.    Assessment/Plan:  33 year old with seropositive myasthenia gravis, acetylcholine receptor binding antibody positive  At initial exam he had ophthalmoplegia and fatigable extraocular movements with ptosis but last exam normal. Today again with right CN3 and left CN6 weakness. S/p thymectomy. Strength is 5 out of 5 however given patient's reports of weakness in the gym I do suspect there may be generalization of myasthenia gravis. Had a long discussion about myasthenia gravis, provided patient with documentation to read as well as reliable online sites for information. Discussed treatments as below, started [name], side effects, risks and lab work needed, risk for bone marrow disease, pancytopenia, hepatotoxicity and other serious sequelae.  Discuss long-term use of steroids and the risks.  - Thymectomy can take up to a year to show results. Discontinue the IVIG, not helping significantly. Continue cellcept. Will titrate steroids off.   - Steroids 40mg  a day will titrate by 10mg  each month and then by 5mg  when we get to 20 and then by 2.5 when we get to 10mg . Check labs today.    - The cellcept has helped tremendously with his diplopia and  ptosis and any generalized symptoms but still reports decreased EOM of the right eye which is bothering him. Started in 2018 and increased to 1500mg  bid 07/2018. Patient will come into the office to check a trough level of cellcept.   -  He does want to get off of the steroids, at this time will decrease to 5mg  every other day but if symptoms worsen will return to 5mg  daily.   - Mestinon 3 times a day as needed  - There is literature to show that removal of all thymus tissue improves outcomes in myasthenia gravis. CT of the chest did not show thymoma or thymus malignancy. Cardiothoracic surgery wanted him to repeat CT chest but patient could not afford.  - MRI of the brain was normal  - EMG/NCS for repetitive nerve stimulation: patient declines, at this point given he seropositive with a negative MRI and clinical response to steroids and Mestinon and cellcept I do think that patient has generalized myasthenia gravis and repetitive nerve stimulation is not necessary at this time.  -cbc, cmp, hgbaic, take vitamin D and calcium  Orders Placed This Encounter  Procedures  . Comprehensive metabolic panel  . CBC with Differential/Platelets  . Hemoglobin A1c  . Vitamin D, 25-hydroxy     PRIOR DISCUSSIONS:   We had a discussion about different medication strategies, possible adverse effects and strategies to manage them, expected time course of benefits in medication management which include hours to days for Mestinon, weeks to many months for prednisone, and many months to a year more for Azathioprine. In mild to moderate generalized myasthenia gravis, Mestinon may be the only treatment. A cholinergic crisis, in which weakness is worsened by increased doses of the Mestinon, rarely occurs. The concomitant  use of prednisone and sometimes another immunosuppressant is often required. In more severe myasthenia gravis, immunosuppression and sometimes immunomodulation should be started at the same time as  Mestinon.  There is evidence that mycophenolate has a more rapid onset of clinical effect. The medicine comes in 250 mg and 500 mg tablets. The standard dose is 1000 mg twice a day. Some people need a higher dose of 1500 mg twice a day. The primary side effect that you might notice is gastrointestinal upset  Discussed all side effects as per patient instructions and above including risk of rare brain disease called PML.  The adverse effects of corticosteroids are numerous, well known, and largely dose-dependent. These include: hypertension, fluid retention, weight gain, potassium loss, hyperlipidemia, diabetes mellitus, osteoporosis, gastric ulceration, cataracts, glaucoma, moon facies, obesity, acne, skin friability, juvenile growth suppression, and mood/personality changes.  Discussed again that should he have acute worsening especially shortness of breath patient should call 911 and be transported to the emergency room due to risks for myasthenia gravis exacerbation and respiratory failure.   Sarina Ill, MD  [facility] [address] [place], [place] [address]  Phone [phone] Fax [phone]  A total of 40 minutes was spent face-to-face with this patient. Over half this time was spent on counseling patient on the  1. Long-term use of high-risk medication   2. Ocular myasthenia gravis ([place])    diagnosis and different diagnostic and therapeutic options, counseling and coordination of care, risks ans benefits of management, compliance, or risk factor reduction and education.

## 2019-09-20 ENCOUNTER — Telehealth: Payer: Self-pay | Admitting: *Deleted

## 2019-09-20 DIAGNOSIS — Z79899 Other long term (current) drug therapy: Secondary | ICD-10-CM | POA: Insufficient documentation

## 2019-09-20 LAB — HEMOGLOBIN A1C
Est. average glucose Bld gHb Est-mCnc: 117 mg/dL
Hgb A1c MFr Bld: 5.7 % — ABNORMAL HIGH (ref 4.8–5.6)

## 2019-09-20 LAB — COMPREHENSIVE METABOLIC PANEL
ALT: 19 IU/L (ref 0–44)
AST: 23 IU/L (ref 0–40)
Albumin/Globulin Ratio: 1.3 (ref 1.2–2.2)
Albumin: 4.7 g/dL (ref 4.0–5.0)
Alkaline Phosphatase: 51 IU/L (ref 39–117)
BUN/Creatinine Ratio: 13 (ref 9–20)
BUN: 12 mg/dL (ref 6–20)
Bilirubin Total: 0.4 mg/dL (ref 0.0–1.2)
CO2: 24 mmol/L (ref 20–29)
Calcium: 9.8 mg/dL (ref 8.7–10.2)
Chloride: 98 mmol/L (ref 96–106)
Creatinine, Ser: 0.94 mg/dL (ref 0.76–1.27)
GFR calc Af Amer: 121 mL/min/{1.73_m2} (ref >59)
GFR calc non Af Amer: 105 mL/min/{1.73_m2} (ref >59)
Globulin, Total: 3.5 g/dL (ref 1.5–4.5)
Glucose: 143 mg/dL — ABNORMAL HIGH (ref 65–99)
Potassium: 4.2 mmol/L (ref 3.5–5.2)
Sodium: 135 mmol/L (ref 134–144)
Total Protein: 8.2 g/dL (ref 6.0–8.5)

## 2019-09-20 LAB — CBC WITH DIFFERENTIAL/PLATELET
Basophils Absolute: 0 10*3/uL (ref 0.0–0.2)
Basos: 1 %
EOS (ABSOLUTE): 0 10*3/uL (ref 0.0–0.4)
Eos: 1 %
Hematocrit: 44.1 % (ref 37.5–51.0)
Hemoglobin: 14.1 g/dL (ref 13.0–17.7)
Immature Grans (Abs): 0 10*3/uL (ref 0.0–0.1)
Immature Granulocytes: 0 %
Lymphocytes Absolute: 0.5 10*3/uL — ABNORMAL LOW (ref 0.7–3.1)
Lymphs: 16 %
MCH: 27.6 pg (ref 26.6–33.0)
MCHC: 32 g/dL (ref 31.5–35.7)
MCV: 86 fL (ref 79–97)
Monocytes Absolute: 0.1 10*3/uL (ref 0.1–0.9)
Monocytes: 5 %
Neutrophils Absolute: 2.2 10*3/uL (ref 1.4–7.0)
Neutrophils: 77 %
Platelets: 107 10*3/uL — ABNORMAL LOW (ref 150–450)
RBC: 5.11 x10E6/uL (ref 4.14–5.80)
RDW: 12.8 % (ref 11.6–15.4)
WBC: 2.8 10*3/uL — ABNORMAL LOW (ref 3.4–10.8)

## 2019-09-20 LAB — VITAMIN D 25 HYDROXY (VIT D DEFICIENCY, FRACTURES): Vit D, 25-Hydroxy: 14.2 ng/mL — ABNORMAL LOW (ref 30.0–100.0)

## 2019-09-20 NOTE — Telephone Encounter (Signed)
Per Dr. Jaynee Eagles, discontinue IVIG. Pt states it is not helping. I called Tiffany w/ [facility] and received a message saying number changed or disconnected. I called Broadus John (pharmacist w/ [facility]) and LVM asking for call back. Advised we are discontinuing pt's IVIG and would also like other contact number for [facility].

## 2019-09-26 ENCOUNTER — Other Ambulatory Visit: Payer: Self-pay | Admitting: Neurology

## 2019-09-26 DIAGNOSIS — Z79899 Other long term (current) drug therapy: Secondary | ICD-10-CM

## 2019-10-11 NOTE — Telephone Encounter (Signed)
We received a post-infusion report from [facility] re: 09/11/2019 IVIG infusion. Pt received Panzyga 70 grams, reported no s/e or adverse reactions, no access or infusion issues noted, and pt tolerated infusion well.

## 2019-11-27 ENCOUNTER — Ambulatory Visit: Payer: BC Managed Care – PPO | Admitting: Neurology

## 2020-01-01 ENCOUNTER — Telehealth: Payer: Self-pay

## 2020-01-01 NOTE — Telephone Encounter (Signed)
I spoke with Eddie [name] and let her know the IVIG had been d/c last October 2020. She verbalized understanding and appreciation. D/c orders may need to be drafted and faxed to [place].

## 2020-01-01 NOTE — Telephone Encounter (Signed)
Carmen  with [employer] wanted to verify if the patient will continure with IVIG therapy as last shipments was 08/2019.  Please follow up

## 2020-01-16 ENCOUNTER — Other Ambulatory Visit: Payer: Self-pay | Admitting: Neurology

## 2020-01-16 DIAGNOSIS — Z79899 Other long term (current) drug therapy: Secondary | ICD-10-CM

## 2020-01-21 ENCOUNTER — Other Ambulatory Visit: Payer: Self-pay

## 2020-01-21 ENCOUNTER — Other Ambulatory Visit (INDEPENDENT_AMBULATORY_CARE_PROVIDER_SITE_OTHER): Payer: Self-pay

## 2020-01-21 DIAGNOSIS — Z79899 Other long term (current) drug therapy: Secondary | ICD-10-CM

## 2020-01-21 DIAGNOSIS — Z0289 Encounter for other administrative examinations: Secondary | ICD-10-CM

## 2020-01-26 LAB — COMPREHENSIVE METABOLIC PANEL
ALT: 27 IU/L (ref 0–44)
AST: 20 IU/L (ref 0–40)
Albumin/Globulin Ratio: 1.9 (ref 1.2–2.2)
Albumin: 4.5 g/dL (ref 4.0–5.0)
Alkaline Phosphatase: 52 IU/L (ref 39–117)
BUN/Creatinine Ratio: 15 (ref 9–20)
BUN: 18 mg/dL (ref 6–20)
Bilirubin Total: 0.3 mg/dL (ref 0.0–1.2)
CO2: 26 mmol/L (ref 20–29)
Calcium: 9.6 mg/dL (ref 8.7–10.2)
Chloride: 103 mmol/L (ref 96–106)
Creatinine, Ser: 1.17 mg/dL (ref 0.76–1.27)
GFR calc Af Amer: 93 mL/min/{1.73_m2} (ref >59)
GFR calc non Af Amer: 80 mL/min/{1.73_m2} (ref >59)
Globulin, Total: 2.4 g/dL (ref 1.5–4.5)
Glucose: 101 mg/dL — ABNORMAL HIGH (ref 65–99)
Potassium: 3.7 mmol/L (ref 3.5–5.2)
Sodium: 143 mmol/L (ref 134–144)
Total Protein: 6.9 g/dL (ref 6.0–8.5)

## 2020-01-26 LAB — CBC
Hematocrit: 44.6 % (ref 37.5–51.0)
Hemoglobin: 14.9 g/dL (ref 13.0–17.7)
MCH: 29.7 pg (ref 26.6–33.0)
MCHC: 33.4 g/dL (ref 31.5–35.7)
MCV: 89 fL (ref 79–97)
Platelets: 162 10*3/uL (ref 150–450)
RBC: 5.01 x10E6/uL (ref 4.14–5.80)
RDW: 12.6 % (ref 11.6–15.4)
WBC: 6.6 10*3/uL (ref 3.4–10.8)

## 2020-01-26 LAB — THYROID PANEL WITH TSH
Free Thyroxine Index: 2.6 (ref 1.2–4.9)
T3 Uptake Ratio: 27 % (ref 24–39)
T4, Total: 9.7 ug/dL (ref 4.5–12.0)
TSH: 0.495 u[IU]/mL (ref 0.450–4.500)

## 2020-01-26 LAB — THIOPURINE METHYLTRANSFERASE (TPMT), RBC: TPMT Activity:: 19.9 Units/mL RBC

## 2020-01-26 LAB — THYROGLOBULIN ANTIBODY: Thyroglobulin Antibody: 1.3 IU/mL — ABNORMAL HIGH (ref 0.0–0.9)

## 2020-01-26 LAB — AMYLASE: Amylase: 98 U/L (ref 31–110)

## 2020-01-26 LAB — T4, FREE: Free T4: 1.4 ng/dL (ref 0.82–1.77)

## 2020-01-26 LAB — THYROID PEROXIDASE ANTIBODY: Thyroperoxidase Ab SerPl-aCnc: 10 IU/mL (ref 0–34)

## 2020-01-30 ENCOUNTER — Other Ambulatory Visit: Payer: Self-pay | Admitting: Neurology

## 2020-01-30 ENCOUNTER — Telehealth: Payer: Self-pay | Admitting: Neurology

## 2020-01-30 DIAGNOSIS — G7000 Myasthenia gravis without (acute) exacerbation: Secondary | ICD-10-CM

## 2020-01-30 MED ORDER — AZATHIOPRINE 50 MG PO TABS: 50.0000 mg | ORAL_TABLET | Freq: Two times a day (BID) | ORAL | 1 refills | Status: DC

## 2020-01-30 NOTE — Progress Notes (Signed)
Starting Azathiprine for myasthenia gravis. The initial dose is 50 mg/day is increased by 50 mg/day every week to a target therapeutic dose of 2-3 mg/kg/day (150 to 200 mg a day roughly)  Monitoring for is recommended with weekly blood count and liver measurements weekly for the first month of treatment, then monthly for the first year, then every three months

## 2020-01-30 NOTE — Telephone Encounter (Signed)
Never mind, he received the email thanks

## 2020-01-30 NOTE — Telephone Encounter (Signed)
Eddie [name], I tried to email him back but got errors about his email address. Would you make sure he got this please? Thanks!  Eddie [name],  We will have to keep a very close eye on your blood work to make sure what happened with cellcept (the decreased white blood cells) doesn't happen with this medication.   The initial dose of medication is 50 mg/day and is increase devery 1-2 weeks to a target therapeutic dose of 23 mg/kg/day (150 to 200 mg a day roughly). [name] with 50mg  twice daily and after each set of normal labs we can increase it.   Monitoring for is recommended with weekly blood count and liver measurements weekly for the first month of treatment, then monthly for the first year, then every three months   I will call in the prescription for you and you can come in weekly for the first month, just call ahead and let us know you are coming.

## 2020-03-03 ENCOUNTER — Other Ambulatory Visit (INDEPENDENT_AMBULATORY_CARE_PROVIDER_SITE_OTHER): Payer: Self-pay

## 2020-03-03 ENCOUNTER — Other Ambulatory Visit: Payer: Self-pay

## 2020-03-03 DIAGNOSIS — Z0289 Encounter for other administrative examinations: Secondary | ICD-10-CM

## 2020-03-03 DIAGNOSIS — G7000 Myasthenia gravis without (acute) exacerbation: Secondary | ICD-10-CM

## 2020-03-04 LAB — COMPREHENSIVE METABOLIC PANEL
ALT: 20 IU/L (ref 0–44)
AST: 20 IU/L (ref 0–40)
Albumin/Globulin Ratio: 1.7 (ref 1.2–2.2)
Albumin: 4 g/dL (ref 4.0–5.0)
Alkaline Phosphatase: 35 IU/L — ABNORMAL LOW (ref 39–117)
BUN/Creatinine Ratio: 13 (ref 9–20)
BUN: 14 mg/dL (ref 6–20)
Bilirubin Total: 0.5 mg/dL (ref 0.0–1.2)
CO2: 25 mmol/L (ref 20–29)
Calcium: 9.5 mg/dL (ref 8.7–10.2)
Chloride: 102 mmol/L (ref 96–106)
Creatinine, Ser: 1.07 mg/dL (ref 0.76–1.27)
GFR calc Af Amer: 103 mL/min/{1.73_m2} (ref >59)
GFR calc non Af Amer: 89 mL/min/{1.73_m2} (ref >59)
Globulin, Total: 2.4 g/dL (ref 1.5–4.5)
Glucose: 98 mg/dL (ref 65–99)
Potassium: 3.7 mmol/L (ref 3.5–5.2)
Sodium: 142 mmol/L (ref 134–144)
Total Protein: 6.4 g/dL (ref 6.0–8.5)

## 2020-03-04 LAB — CBC WITH DIFFERENTIAL/PLATELET
Basophils Absolute: 0 10*3/uL (ref 0.0–0.2)
Basos: 1 %
EOS (ABSOLUTE): 0.1 10*3/uL (ref 0.0–0.4)
Eos: 2 %
Hematocrit: 41.1 % (ref 37.5–51.0)
Hemoglobin: 13.8 g/dL (ref 13.0–17.7)
Immature Grans (Abs): 0 10*3/uL (ref 0.0–0.1)
Immature Granulocytes: 0 %
Lymphocytes Absolute: 1.6 10*3/uL (ref 0.7–3.1)
Lymphs: 32 %
MCH: 30.3 pg (ref 26.6–33.0)
MCHC: 33.6 g/dL (ref 31.5–35.7)
MCV: 90 fL (ref 79–97)
Monocytes Absolute: 0.4 10*3/uL (ref 0.1–0.9)
Monocytes: 9 %
Neutrophils Absolute: 2.8 10*3/uL (ref 1.4–7.0)
Neutrophils: 56 %
Platelets: 153 10*3/uL (ref 150–450)
RBC: 4.55 x10E6/uL (ref 4.14–5.80)
RDW: 12.5 % (ref 11.6–15.4)
WBC: 4.9 10*3/uL (ref 3.4–10.8)

## 2020-03-04 LAB — AMYLASE: Amylase: 95 U/L (ref 31–110)

## 2020-03-04 LAB — LIPASE: Lipase: 24 U/L (ref 13–78)

## 2020-03-06 ENCOUNTER — Encounter: Payer: Self-pay | Admitting: *Deleted

## 2020-04-04 IMAGING — DX PORTABLE CHEST - 1 VIEW
1 series · 1 of 1 positions shown · non-contrast
Comparison: Film from earlier in the same day.

CLINICAL DATA: Status post chest tube removal

EXAM:
PORTABLE CHEST 1 VIEW

[chest ap]
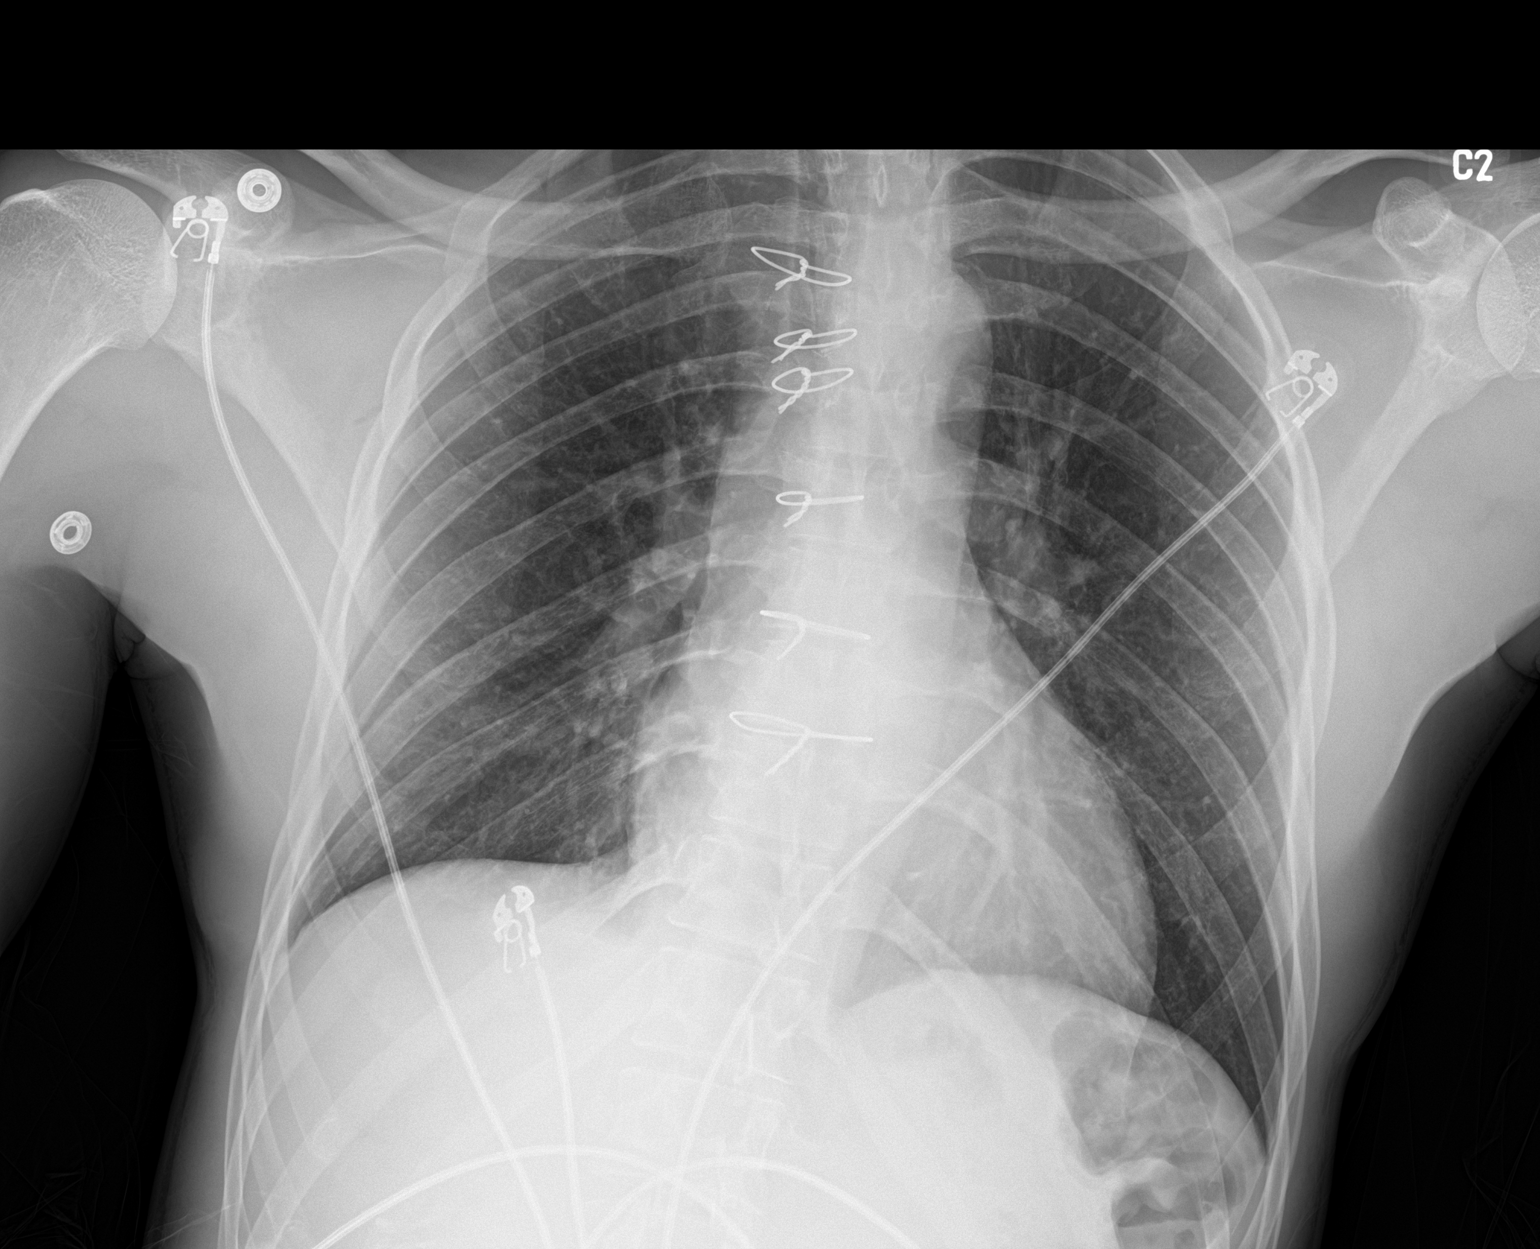

[1 of 1 positions shown; findings below may reference images not displayed]

FINDINGS: Right-sided chest tube has been removed. Tiny pneumothorax remains
although decreased in size when compared with the prior exam. No new
focal abnormality is seen. Postsurgical changes are noted.
IMPRESSION: Minimal residual pneumothorax following chest tube removal.

## 2020-04-16 ENCOUNTER — Other Ambulatory Visit: Payer: Self-pay | Admitting: Neurology

## 2020-04-16 DIAGNOSIS — Z79899 Other long term (current) drug therapy: Secondary | ICD-10-CM

## 2020-04-16 DIAGNOSIS — G7000 Myasthenia gravis without (acute) exacerbation: Secondary | ICD-10-CM

## 2020-04-16 MED ORDER — AZATHIOPRINE 50 MG PO TABS: 50.0000 mg | ORAL_TABLET | Freq: Two times a day (BID) | ORAL | 1 refills | Status: DC

## 2020-04-16 NOTE — Progress Notes (Signed)
Meds ordered this encounter  Medications  . azaTHIOprine (IMURAN) 50 MG tablet    Sig: Take 1 tablet (50 mg total) by mouth 2 (two) times daily.    Dispense:  180 tablet    Refill:  1   Orders Placed This Encounter  Procedures  . CBC with Differential/Platelets  . Comprehensive metabolic panel  . Lipase  . Amylase  . Thiopurine Metabolites

## 2020-04-21 ENCOUNTER — Other Ambulatory Visit: Payer: Self-pay

## 2020-04-21 ENCOUNTER — Other Ambulatory Visit (INDEPENDENT_AMBULATORY_CARE_PROVIDER_SITE_OTHER): Payer: Self-pay

## 2020-04-21 DIAGNOSIS — G7000 Myasthenia gravis without (acute) exacerbation: Secondary | ICD-10-CM

## 2020-04-21 DIAGNOSIS — Z0289 Encounter for other administrative examinations: Secondary | ICD-10-CM

## 2020-04-21 DIAGNOSIS — Z79899 Other long term (current) drug therapy: Secondary | ICD-10-CM

## 2020-04-23 ENCOUNTER — Other Ambulatory Visit: Payer: Self-pay | Admitting: *Deleted

## 2020-04-23 DIAGNOSIS — G7000 Myasthenia gravis without (acute) exacerbation: Secondary | ICD-10-CM

## 2020-04-23 DIAGNOSIS — Z79899 Other long term (current) drug therapy: Secondary | ICD-10-CM

## 2020-04-25 LAB — CBC WITH DIFFERENTIAL/PLATELET
Basophils Absolute: 0 10*3/uL (ref 0.0–0.2)
Basos: 0 %
EOS (ABSOLUTE): 0 10*3/uL (ref 0.0–0.4)
Eos: 0 %
Hematocrit: 42.6 % (ref 37.5–51.0)
Hemoglobin: 14.7 g/dL (ref 13.0–17.7)
Immature Grans (Abs): 0 10*3/uL (ref 0.0–0.1)
Immature Granulocytes: 0 %
Lymphocytes Absolute: 0.5 10*3/uL — ABNORMAL LOW (ref 0.7–3.1)
Lymphs: 11 %
MCH: 30.3 pg (ref 26.6–33.0)
MCHC: 34.5 g/dL (ref 31.5–35.7)
MCV: 88 fL (ref 79–97)
Monocytes Absolute: 0.2 10*3/uL (ref 0.1–0.9)
Monocytes: 5 %
Neutrophils Absolute: 3.8 10*3/uL (ref 1.4–7.0)
Neutrophils: 84 %
Platelets: 119 10*3/uL — ABNORMAL LOW (ref 150–450)
RBC: 4.85 x10E6/uL (ref 4.14–5.80)
RDW: 12.5 % (ref 11.6–15.4)
WBC: 4.5 10*3/uL (ref 3.4–10.8)

## 2020-04-25 LAB — COMPREHENSIVE METABOLIC PANEL
ALT: 20 IU/L (ref 0–44)
AST: 20 IU/L (ref 0–40)
Albumin/Globulin Ratio: 1.9 (ref 1.2–2.2)
Albumin: 4.6 g/dL (ref 4.0–5.0)
Alkaline Phosphatase: 36 IU/L — ABNORMAL LOW (ref 48–121)
BUN/Creatinine Ratio: 14 (ref 9–20)
BUN: 14 mg/dL (ref 6–20)
Bilirubin Total: 0.5 mg/dL (ref 0.0–1.2)
CO2: 21 mmol/L (ref 20–29)
Calcium: 10 mg/dL (ref 8.7–10.2)
Chloride: 102 mmol/L (ref 96–106)
Creatinine, Ser: 1.02 mg/dL (ref 0.76–1.27)
GFR calc Af Amer: 110 mL/min/{1.73_m2} (ref >59)
GFR calc non Af Amer: 95 mL/min/{1.73_m2} (ref >59)
Globulin, Total: 2.4 g/dL (ref 1.5–4.5)
Glucose: 124 mg/dL — ABNORMAL HIGH (ref 65–99)
Potassium: 4.2 mmol/L (ref 3.5–5.2)
Sodium: 139 mmol/L (ref 134–144)
Total Protein: 7 g/dL (ref 6.0–8.5)

## 2020-04-25 LAB — AMYLASE: Amylase: 90 U/L (ref 31–110)

## 2020-04-25 LAB — LIPASE: Lipase: 15 U/L (ref 13–78)

## 2020-04-25 LAB — SERIAL MONITORING

## 2020-04-25 LAB — THIOPURINE METABOLITES
6-MMPN Metaboilte: 822 pmol/8x 10E8
6-TGN Metabolite: 153 pmol/8x 10E8

## 2020-09-04 ENCOUNTER — Telehealth: Payer: Self-pay | Admitting: Neurology

## 2020-09-04 NOTE — Telephone Encounter (Signed)
Eddie [name], will you call and get him an appointment with me in December or January. Thanks

## 2020-11-10 ENCOUNTER — Encounter: Payer: Self-pay | Admitting: *Deleted

## 2020-11-17 ENCOUNTER — Ambulatory Visit: Payer: BC Managed Care – PPO | Admitting: Neurology

## 2020-11-19 ENCOUNTER — Ambulatory Visit: Payer: BC Managed Care – PPO | Admitting: Neurology

## 2020-11-19 ENCOUNTER — Encounter: Payer: Self-pay | Admitting: Neurology

## 2020-11-19 VITALS — BP 126/82 | HR 62 | Ht 68.0 in | Wt 151.0 lb

## 2020-11-19 DIAGNOSIS — G7000 Myasthenia gravis without (acute) exacerbation: Secondary | ICD-10-CM

## 2020-11-19 DIAGNOSIS — Z79899 Other long term (current) drug therapy: Secondary | ICD-10-CM

## 2020-11-19 MED ORDER — AZATHIOPRINE 50 MG PO TABS: ORAL_TABLET | ORAL | 6 refills | Status: DC

## 2020-11-19 MED ORDER — PREDNISONE 5 MG PO TABS: 10.0000 mg | ORAL_TABLET | Freq: Every day | ORAL | 3 refills | Status: DC

## 2020-11-19 NOTE — Progress Notes (Signed)
[facility]    Provider:  Dr Jaynee Eagles Referring Provider: Elby Beck, FNP Primary Care Physician:  Elby Beck, FNP  CC:  Myasthenia gravis  He is on 10mg  a day of prednisone. He went 3 weeks before needing steroids again. We discussed the following: Increase imuran Come back in 6 weeks for blood work Stay on 10mg  for 1-2 months then reduce to 7.5mg . goal is get < 7.5 mg or be off completely Follow up 3-4 months please give patient a 4pm appointment   interval history 09/20/2019: 34 year old with Seropositive myasthenia gravis( Ach: Receptor binding antibodies were positive at 0.92). He started [name] 06/2017 100mg  bid and was increased to 1500mg  bid 07/2018. Dose had to be adjusted due to low WBCs. Was on IVIG prior to thymectomy. 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 The thymectomy can take up to a year to show results, will keep on current dose of cellcept and taper steroids.  Examined patient 04/27/2019 and emailed with patient over the weekend.  Patient with worsening ptosis, ophthalmoplegia, significant diplopia and impairment of vision since decreasing [name] due to low white blood cells, cannot start another immunosuppressant due to his neutropenia,, I have advised patient not to drive, I have increased his steroids unfortunately steroids can take 6 to 12 weeks to work and we cannot keep them for longer than 2 to 3 weeks because we hope you will have his thymus removed and steroids impair healing, need IVIG for myasthenia gravis crisis.  Examined patient's EOM this morning when he came for labs 04/04/2019, impaired right eye adduction. Took a trough level of cellcept today if he is a fast metabolizer and level not therapeutic may consider increasing doseage of cellcept. 04/04/2019.   Interval history 03/29/2019:  34 year old with Seropositive myasthenia gravis( Ach: Receptor binding antibodies were positive at 0.92). He started [name] 06/2017 100mg  bid and  was increased to 1500mg  bid 07/2018. He is on 5mg  of prednisone daily. He feels well except he reports his right eye still does no have full EOM and he cannot fully abduct. He feels he was much better when he was on higher doses of steroids. The cellcept has helped tremendously with his diplopia and ptosis and any generalized symptoms but still reports decreased EOM of the right eye which is bothering him. He does want to get off of the steroids, at this time will decrease to 5mg  every other day but if symptoms worsen will return to 5mg  daily.   Interval history 08/03/2018: He has not been here for over a year and not returned our calls. He has been well. He has experienced more opthalmoplegia when off of the prednisone but at 40mg  with the cellcept he does well. Will increase cellcept. Discussed we really need to decrease his steroids long-term at this dose (or any dose) can cause significant sequelae. Discussed he should be on calcium and vitamin D. Also needs repeat CT chest and eval to cardiothoracic for removal of thymic tissue. Needs labs today and again in 3-4 months. And he needs to be more compliant with his appointments, he has not been here in over a year and has not returned multiple of our calls.  Interval history 05/18/2017: Seropositive myasthenia gravis. Reviewed MRI of the brain with patient, reviewed images which were unremarkable. CT of the chest did not show thymoma or thymus tumor but discussed that there is evidence that removing any thymic tissue improves outcomes in myasthenia gravis. Patient would like to be  seen by cardiothoracic surgeon. Provided literature in myasthenia gravis and thymectomy. Patient started on 40 mg of steroids. He feels improved, his strength is better, getting more repetitions in the gym, the ptosis is minimally better. The mestinon helps with the diplopia. He only takes a 1/2 pill didn't like a whole pill of mestinon. But he feels improved. Had a long discussion  about seropositive myasthenia gravis,   HPI:  Eddie [name] is a 34 y.o. male here as a referral from Dr. Carlean Purl for myasthenia gravis. Patient reported diplopia and vision changes starting in February. 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 He was instructed to have thyroid tests as well as myasthenia gravis panel checked. 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 Ach: Receptor binding antibodies were positive at 0.92. Back in February he went on a ski trip and towards the end his eyes started feeling weak with trouble focusing. Father had a thyroid issue so he had testing and tsh was normal. He woke up one night nd looked at the clock on the cable box and saw 2 different times. Dr. Manuella Ghazi diagnosed him with thyrotoxicosis and then he was told he had myasthenia gravis. His right eye wanders now. He is having difficulty lifting his arms and working out, he can;t weight lift as well as he used to. No issues swallowing but he has loss of strength. The double vision is worse in the morning and bad all day. Not better after a nap is worse. His eyes are misaligned. He has ptosis. No swallowing difficulty. No voice qualityor breathing difficulties. Feels like it is progressing. Strong family history of autoimmune disease.  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 Symptoms have gotten worse since then. He went to see an ophthalmologist who diagnosed him with thyrotoxicosis and diffuse goiter, ocular muscle weakness. He was to hold have thyroid tests and myasthenia gravis panel checked. Labs showed thyrotropin receptor autoantibodies, thyroid stimulating immunoglobulin and he was referred to  endocrinology. Also reviewed endocrinology diagnosed with Graves' disease.  Ach Receptor binding antibodies were positive.   Review of Systems: Patient complains of symptoms per HPI as well as the following symptoms: diplopia. No chest pain, no rashes, no shortness of breath. Pertinent negatives and positives per HPI. All others negative.    Social History   Socioeconomic History  . Marital status: Single    Spouse name: Not on file  . Number of children: 2  . Years of education: 62  . Highest education level: Not on file  Occupational History  . Occupation: [employer]  Tobacco Use  . Smoking status: Never Smoker  . Smokeless tobacco: Never Used  Vaping Use  . Vaping Use: Never used  Substance and Sexual Activity  . Alcohol use: Yes    Alcohol/week: 2.0 standard drinks    Types: 2 Shots of liquor per week  . Drug use: No  . Sexual activity: Yes    Birth control/protection: Condom  Other Topics Concern  . Not on file  Social History Narrative   Lives at home with daughter    Caffeine: coffee 1 cup daily Monday-Friday   Right handed   Social Determinants of Health   Financial Resource Strain: Not on  file  Food Insecurity: Not on file  Transportation Needs: Not on file  Physical Activity: Not on file  Stress: Not on file  Social Connections: Not on file  Intimate Partner Violence: Not on file    Family History  Problem Relation Age of Onset  . Cancer Mother   . Thyroid disease Father   . Thyroid disease Brother     Past Medical History:  Diagnosis Date  . Myasthenia gravis [facility])     Past Surgical History:  Procedure Laterality Date  . eyelid surgery Right    as a teenager  . STERNOTOMY N/A 06/12/2019   Procedure: PARTIAL STERNOTOMY;  Surgeon: Ivin Poot, MD;  Location: [place];  Service: Thoracic;  Laterality: N/A;  . THYMECTOMY N/A 06/12/2019   Procedure: THYMECTOMY;  Surgeon: Prescott Gum, Collier Salina, MD;  Location: [facility] OR;  Service:  Thoracic;  Laterality: N/A;    Current Outpatient Medications  Medication Sig Dispense Refill  . azaTHIOprine (IMURAN) 50 MG tablet Take 1 tablet (50 mg total) by mouth 2 (two) times daily. 180 tablet 1  . Nutritional Supplements (PROTEIN SUPPLEMENT 80% PO) Take 1 Scoop by mouth 2 (two) times a day. MIXED WITH MILK    . timolol (BETIMOL) 0.25 % ophthalmic solution Place 1 drop into both eyes 2 (two) times daily.     Marland Kitchen azaTHIOprine (IMURAN) 50 MG tablet Take one pill in the morning and 2 pills at bedtime. 270 tablet 6  . predniSONE (DELTASONE) 5 MG tablet Take 2 tablets (10 mg total) by mouth daily with breakfast. 180 tablet 3   No current facility-administered medications for this visit.    Allergies as of 11/19/2020  . (No Known Allergies)    Vitals: BP 126/82 (BP Location: Right Arm, Patient Position: Sitting)   Pulse 62   Ht 5\' 8"  (1.727 m)   Wt 151 lb (68.5 kg)   BMI 22.96 kg/m  Last Weight:  Wt Readings from Last 1 Encounters:  11/19/20 151 lb (68.5 kg)   Last Height:   Ht Readings from Last 1 Encounters:  11/19/20 5\' 8"  (1.727 m)       Neuro: Detailed Neurologic Exam  Speech:    Speech is normal; fluent and spontaneous with normal comprehension.  Cognition:    The patient is oriented to person, place, and time;     recent and remote memory intact;     language fluent;     normal attention, concentration,     fund of knowledge Cranial Nerves:    The pupils are equal, round, and reactive to light. The fundi are normal and spontaneous venous pulsations are present. Visual fields are full to finger confrontation. Slight Disconjugate gaze, Ptosis not present, Trigeminal sensation is intact and the muscles of mastication are normal. The face is symmetric. The palate elevates in the midline. Hearing intact. Voice is normal. Shoulder shrug is normal. The tongue has normal motion without fasciculations.   Coordination:   No dysmetria or ataxia  Gait:   normal  native gait  Motor Observation:    No asymmetry, no atrophy, and no involuntary movements noted. Tone:    Normal muscle tone.    Posture:    Posture is normal. normal erect    Strength:    Strength is V/V in the upper and lower limbs.      Sensation: intact to LT     Reflex Exam:  DTR's:    Deep tendon reflexes in the upper and lower extremities  are normal bilaterally.   Toes:    The toes are downgoing bilaterally.   Clonus:    Clonus is absent.    Assessment/Plan:  34 year old with seropositive myasthenia gravis, acetylcholine receptor binding antibody positive  At initial exam he had ophthalmoplegia and fatigable extraocular movements with ptosis but today exam slight dysconjugate gaze was off of steroids for 3 wekks but had symptoms restarted at 10mg .  S/p thymectomy. Strength is 5 out of 5 however given patient's reports of weakness in the gym(resolved) I do suspect there was generalization of myasthenia gravis. Had a long discussion about myasthenia gravis, provided patient with documentation to read as well as reliable online sites for information. Discussed treatments as below, started [name], side effects, risks and lab work needed, risk for bone marrow disease, pancytopenia, hepatotoxicity and other serious sequelae.  Discuss long-term use of steroids and the risks.s/p thymectomy.  Increase imuran (cellcept with leukopenia, IVIG did not help) Come back in 6 weeks for blood work Stay on 10mg  for 1-2 months then reduce to 7.5mg  prednisone Follow up 3-4 months please give patient a 4pm appointment  - Thymectomy can take up to a year or longer to show results.   0 [name] tarted in 2018 and increased to 1500mg  bid 07/2018, stopped due to leukopenia  - Mestinon 3 times a day as needed  - MRI of the brain was normal  - EMG/NCS for repetitive nerve stimulation was not needed, at this point given he seropositive with a negative MRI and clinical response to steroids  and Mestinon and cellcept I do think that patient has  primarily ocular MG but had symptoms of generalized myasthenia gravis   -cbc, cmp, hgbaic, take vitamin D and calcium  Orders Placed This Encounter  Procedures  . CBC with Differential/Platelets  . Comprehensive metabolic panel  . Lipase  . Amylase    I spent 40  minutes of face-to-face and non-face-to-face time with patient on the  1. Long-term use of high-risk medication   2. Myasthenia gravis ([place])    diagnosis.  This included previsit chart review, lab review, study review, order entry, electronic health record documentation, patient education on the different diagnostic and therapeutic options, counseling and coordination of care, risks and benefits of management, compliance, or risk factor reduction    PRIOR DISCUSSIONS:   We had a discussion about different medication strategies, possible adverse effects and strategies to manage them, expected time course of benefits in medication management which include hours to days for Mestinon, weeks to many months for prednisone, and many months to a year more for Azathioprine. In mild to moderate generalized myasthenia gravis, Mestinon may be the only treatment. A cholinergic crisis, in which weakness is worsened by increased doses of the Mestinon, rarely occurs. The concomitant use of prednisone and sometimes another immunosuppressant is often required. In more severe myasthenia gravis, immunosuppression and sometimes immunomodulation should be started at the same time as Mestinon.  There is evidence that mycophenolate has a more rapid onset of clinical effect. The medicine comes in 250 mg and 500 mg tablets. The standard dose is 1000 mg twice a day. Some people need a higher dose of 1500 mg twice a day. The primary side effect that you might notice is gastrointestinal upset  Discussed all side effects as per patient instructions and above including risk of rare brain disease called  PML.  The adverse effects of corticosteroids are numerous, well known, and largely dose-dependent. These include: hypertension, fluid retention, weight  gain, potassium loss, hyperlipidemia, diabetes mellitus, osteoporosis, gastric ulceration, cataracts, glaucoma, moon facies, obesity, acne, skin friability, juvenile growth suppression, and mood/personality changes.  Discussed again that should he have acute worsening especially shortness of breath patient should call 911 and be transported to the emergency room due to risks for myasthenia gravis exacerbation and respiratory failure.   Sarina Ill, MD  [facility] [address] [place], [place] [address]  Phone [phone] Fax [phone]  A total of 40 minutes was spent face-to-face with this patient. Over half this time was spent on counseling patient on the  1. Long-term use of high-risk medication   2. Myasthenia gravis ([place])    diagnosis and different diagnostic and therapeutic options, counseling and coordination of care, risks ans benefits of management, compliance, or risk factor reduction and education.

## 2020-11-19 NOTE — Patient Instructions (Signed)
Increase imuran Come back in 6 weeks for blood work [place] on 10mg  for 1-2 months then reduce to 7.5mg  Follow up 3-4 months please give patient a 4pm appointment

## 2020-11-20 LAB — COMPREHENSIVE METABOLIC PANEL
ALT: 22 IU/L (ref 0–44)
AST: 20 IU/L (ref 0–40)
Albumin/Globulin Ratio: 1.8 (ref 1.2–2.2)
Albumin: 4.5 g/dL (ref 4.0–5.0)
Alkaline Phosphatase: 50 IU/L (ref 44–121)
BUN/Creatinine Ratio: 15 (ref 9–20)
BUN: 18 mg/dL (ref 6–20)
Bilirubin Total: 0.5 mg/dL (ref 0.0–1.2)
CO2: 25 mmol/L (ref 20–29)
Calcium: 9.7 mg/dL (ref 8.7–10.2)
Chloride: 104 mmol/L (ref 96–106)
Creatinine, Ser: 1.17 mg/dL (ref 0.76–1.27)
GFR calc Af Amer: 92 mL/min/{1.73_m2} (ref >59)
GFR calc non Af Amer: 80 mL/min/{1.73_m2} (ref >59)
Globulin, Total: 2.5 g/dL (ref 1.5–4.5)
Glucose: 93 mg/dL (ref 65–99)
Potassium: 3.9 mmol/L (ref 3.5–5.2)
Sodium: 144 mmol/L (ref 134–144)
Total Protein: 7 g/dL (ref 6.0–8.5)

## 2020-11-20 LAB — CBC WITH DIFFERENTIAL/PLATELET
Basophils Absolute: 0 10*3/uL (ref 0.0–0.2)
Basos: 1 %
EOS (ABSOLUTE): 0.1 10*3/uL (ref 0.0–0.4)
Eos: 2 %
Hematocrit: 43.2 % (ref 37.5–51.0)
Hemoglobin: 14.2 g/dL (ref 13.0–17.7)
Immature Grans (Abs): 0 10*3/uL (ref 0.0–0.1)
Immature Granulocytes: 1 %
Lymphocytes Absolute: 1.2 10*3/uL (ref 0.7–3.1)
Lymphs: 32 %
MCH: 29.5 pg (ref 26.6–33.0)
MCHC: 32.9 g/dL (ref 31.5–35.7)
MCV: 90 fL (ref 79–97)
Monocytes Absolute: 0.4 10*3/uL (ref 0.1–0.9)
Monocytes: 11 %
Neutrophils Absolute: 2 10*3/uL (ref 1.4–7.0)
Neutrophils: 53 %
Platelets: 189 10*3/uL (ref 150–450)
RBC: 4.82 x10E6/uL (ref 4.14–5.80)
RDW: 12.6 % (ref 11.6–15.4)
WBC: 3.8 10*3/uL (ref 3.4–10.8)

## 2020-11-20 LAB — LIPASE: Lipase: 26 U/L (ref 13–78)

## 2020-11-20 LAB — AMYLASE: Amylase: 109 U/L (ref 31–110)

## 2020-11-23 ENCOUNTER — Encounter: Payer: Self-pay | Admitting: Neurology

## 2021-02-19 ENCOUNTER — Ambulatory Visit: Payer: BC Managed Care – PPO | Admitting: Neurology

## 2021-02-25 NOTE — Progress Notes (Signed)
[facility]    Provider:  Dr Jaynee Eagles Referring Provider: No ref. provider found Primary Care Physician:  Elby Beck, FNP (Inactive)  CC:  Myasthenia gravis  Virtual Visit via Telephone Note  I connected with Eddie [name] on 02/26/21 at  4:00 PM EDT by telephone and verified that I am speaking with the correct person using two identifiers.  Location: Patient: Home Provider: office   I discussed the limitations, risks, security and privacy concerns of performing an evaluation and management service by telephone and the availability of in person appointments. I also discussed with the patient that there may be a patient responsible charge related to this service. The patient expressed understanding and agreed to proceed.     Follow Up Instructions:    I discussed the assessment and treatment plan with the patient. The patient was provided an opportunity to ask questions and all were answered. The patient agreed with the plan and demonstrated an understanding of the instructions.   The patient was advised to call back or seek an in-person evaluation if the symptoms worsen or if the condition fails to improve as anticipated.  I provided  10 minutes of non-face-to-face time during this encounter.   [name], MD   Doing well on azathioprine. He is taking 100mg  twice dialy. He is on 10mg  of steroids a day, he still has some diplopia (minimal) so we will hold here and discuss decreasing to 7.5mg  at next appointment and get labs too.   12/22/2021He is on 10mg  a day of prednisone. He went 3 weeks before needing steroids again. We discussed the following: Increase imuran Come back in 6 weeks for blood work Stay on 10mg  for 1-2 months then reduce to 7.5mg . goal is get < 7.5 mg or be off completely Follow up 3-4 months please give patient a 4pm appointment   interval history 09/20/2019: 35 year old with Seropositive myasthenia gravis( Ach: Receptor  binding antibodies were positive at 0.92). He started [name] 06/2017 100mg  bid and was increased to 1500mg  bid 07/2018. Dose had to be adjusted due to low WBCs. Was on IVIG prior to thymectomy. 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 The thymectomy can take up to a year to show results, will keep on current dose of cellcept and taper steroids.  Examined patient 04/27/2019 and emailed with patient over the weekend.  Patient with worsening ptosis, ophthalmoplegia, significant diplopia and impairment of vision since decreasing [name] due to low white blood cells, cannot start another immunosuppressant due to his neutropenia,, I have advised patient not to drive, I have increased his steroids unfortunately steroids can take 6 to 12 weeks to work and we cannot keep them for longer than 2 to 3 weeks because we hope you will have his thymus removed and steroids impair healing, need IVIG for myasthenia gravis crisis.  Examined patient's EOM this morning when he came for labs 04/04/2019, impaired right eye adduction. Took a trough level of cellcept today if he is a fast metabolizer and level not therapeutic may consider increasing doseage of cellcept. 04/04/2019.   Interval history 03/29/2019:  35 year old with Seropositive myasthenia gravis( Ach: Receptor binding antibodies were positive at 0.92). He started [name] 06/2017 100mg  bid and was increased to 1500mg  bid 07/2018. He is on 5mg  of prednisone daily. He feels well except he reports his right eye still does no have full EOM and he cannot fully abduct. He feels he was much better when he was on higher doses  of steroids. The cellcept has helped tremendously with his diplopia and ptosis and any generalized symptoms but still reports decreased EOM of the right eye which is bothering him. He does want to get off of the steroids, at this time will decrease to 5mg  every other day but if symptoms worsen will return to 5mg  daily.   Interval history 08/03/2018: He has not been  here for over a year and not returned our calls. He has been well. He has experienced more opthalmoplegia when off of the prednisone but at 40mg  with the cellcept he does well. Will increase cellcept. Discussed we really need to decrease his steroids long-term at this dose (or any dose) can cause significant sequelae. Discussed he should be on calcium and vitamin D. Also needs repeat CT chest and eval to cardiothoracic for removal of thymic tissue. Needs labs today and again in 3-4 months. And he needs to be more compliant with his appointments, he has not been here in over a year and has not returned multiple of our calls.  Interval history 05/18/2017: Seropositive myasthenia gravis. Reviewed MRI of the brain with patient, reviewed images which were unremarkable. CT of the chest did not show thymoma or thymus tumor but discussed that there is evidence that removing any thymic tissue improves outcomes in myasthenia gravis. Patient would like to be seen by cardiothoracic surgeon. Provided literature in myasthenia gravis and thymectomy. Patient started on 40 mg of steroids. He feels improved, his strength is better, getting more repetitions in the gym, the ptosis is minimally better. The mestinon helps with the diplopia. He only takes a 1/2 pill didn't like a whole pill of mestinon. But he feels improved. Had a long discussion about seropositive myasthenia gravis,   HPI:  Eddie [name] is a 35 y.o. male here as a referral from Dr. Carlean Purl for myasthenia gravis. Patient reported diplopia and vision changes starting in February. 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 He was instructed to have thyroid tests as well as myasthenia gravis panel checked. 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 Ach: Receptor binding antibodies were positive at 0.92. Back in February he went on a  ski trip and towards the end his eyes started feeling weak with trouble focusing. Father had a thyroid issue so he had testing and tsh was normal. He woke up one night nd looked at the clock on the cable box and saw 2 different times. Dr. Manuella Ghazi diagnosed him with thyrotoxicosis and then he was told he had myasthenia gravis. His right eye wanders now. He is having difficulty lifting his arms and working out, he can;t weight lift as well as he used to. No issues swallowing but he has loss of strength. The double vision is worse in the morning and bad all day. Not better after a nap is worse. His eyes are misaligned. He has ptosis. No swallowing difficulty. No voice qualityor breathing difficulties. Feels like it is progressing. Strong family history of autoimmune disease.  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 Symptoms have gotten worse since then. He went to see an ophthalmologist who diagnosed him with thyrotoxicosis and diffuse goiter, ocular muscle weakness. He was to hold have thyroid tests and myasthenia gravis  panel checked. Labs showed thyrotropin receptor autoantibodies, thyroid stimulating immunoglobulin and he was referred to endocrinology. Also reviewed endocrinology diagnosed with Graves' disease.  Ach Receptor binding antibodies were positive.   Review of Systems: Patient complains of symptoms per HPI as well as the following symptoms: diplopia. No chest pain, no rashes, no shortness of breath. Pertinent negatives and positives per HPI. All others negative.    Social History   Socioeconomic History  . Marital status: Single    Spouse name: Not on file  . Number of children: 2  . Years of education: 34  . Highest education level: Not on file  Occupational History  . Occupation: [employer]  Tobacco Use  . Smoking status: Never Smoker  . Smokeless tobacco: Never Used  Vaping Use  . Vaping Use: Never used  Substance and Sexual Activity  . Alcohol use: Yes    Alcohol/week: 2.0 standard drinks    Types: 2 Shots of liquor per week  . Drug use: No  . Sexual activity: Yes    Birth control/protection: Condom  Other Topics Concern  . Not on file  Social History Narrative   Lives at home with daughter    Caffeine: coffee 1 cup daily Monday-Friday   Right handed   Social Determinants of Health   Financial Resource Strain: Not on file  Food Insecurity: Not on file  Transportation Needs: Not on file  Physical Activity: Not on file  Stress: Not on file  Social Connections: Not on file  Intimate Partner Violence: Not on file    Family History  Problem Relation Age of Onset  . Cancer Mother   . Thyroid disease Father   . Thyroid disease Brother     Past Medical History:  Diagnosis Date  . Myasthenia gravis [facility])     Past Surgical History:  Procedure Laterality Date  . eyelid surgery Right    as a teenager  . STERNOTOMY N/A 06/12/2019   Procedure: PARTIAL STERNOTOMY;  Surgeon: Ivin Poot, MD;  Location: [facility];  Service: Thoracic;  Laterality: N/A;  . THYMECTOMY N/A 06/12/2019   Procedure: THYMECTOMY;  Surgeon: Prescott Gum, Collier Salina, MD;  Location: [facility] OR;  Service: Thoracic;  Laterality: N/A;    Current Outpatient Medications  Medication Sig Dispense Refill  . azaTHIOprine (IMURAN) 50 MG tablet Take 2 tablets (100 mg total) by mouth 2 (two) times daily. 360 tablet 3  . Nutritional Supplements (PROTEIN SUPPLEMENT 80% PO) Take 1 Scoop by mouth 2 (two) times a day. MIXED WITH MILK    . predniSONE (DELTASONE) 5 MG tablet Take 2 tablets (10 mg total) by mouth daily with breakfast. 180 tablet 3  . timolol (BETIMOL) 0.25 % ophthalmic solution Place 1 drop into both eyes 2 (two) times daily.      No current facility-administered medications for this  visit.    Allergies as of 02/26/2021  . (No Known Allergies)    Vitals: There were no vitals taken for this visit. Last Weight:  Wt Readings from Last 1 Encounters:  11/19/20 151 lb (68.5 kg)   Last Height:   Ht Readings from Last 1 Encounters:  11/19/20 5\' 8"  (1.727 m)    On the phone   Assessment/Plan:  35 year old with seropositive myasthenia gravis, acetylcholine receptor binding antibody positive  At initial exam he had ophthalmoplegia and fatigable extraocular movements with ptosis but today exam slight dysconjugate gaze was off of steroids for 3 wekks but had symptoms restarted at 10mg .  S/p thymectomy. Strength is 5 out of 5 however given patient's reports of weakness in the gym(resolved) I do suspect there was generalization of myasthenia gravis. Had a long discussion about myasthenia gravis, provided patient with documentation to read as well as reliable online sites for information. Discussed treatments as below, started [name], side effects, risks and lab work needed, risk for bone marrow disease, pancytopenia, hepatotoxicity and other serious sequelae.  Discuss long-term use of steroids and the risks.s/p thymectomy.  Doing well on azathioprine. He is taking 100mg  twice daily. He is on 10mg  of steroids a day, he still has some diplopia (minimal) so we will hold here and discuss decreasing to 7.5mg  at next appointment and get labs too. He can come get blood work done.   PRIOR  Increase imuran (cellcept with leukopenia, IVIG did not help) Come back in 6 weeks for blood work Stay on 10mg  for 1-2 months then reduce to 7.5mg  prednisone Follow up 3-4 months please give patient a 4pm appointment  - Thymectomy can take up to a year or longer to show results.   0 [name] tarted in 2018 and increased to 1500mg  bid 07/2018, stopped due to leukopenia  - Mestinon 3 times a day as needed  - MRI of the brain was normal  - EMG/NCS for repetitive nerve stimulation was not  needed, at this point given he seropositive with a negative MRI and clinical response to steroids and Mestinon and cellcept I do think that patient has  primarily ocular MG but had symptoms of generalized myasthenia gravis   -cbc, cmp, hgbaic, take vitamin D and calcium  Orders Placed This Encounter  Procedures  . CBC with Differential/Platelets  . Comprehensive metabolic panel    I spent 40  minutes of face-to-face and non-face-to-face time with patient on the  1. Myasthenia gravis ([place])    diagnosis.  This included previsit chart review, lab review, study review, order entry, electronic health record documentation, patient education on the different diagnostic and therapeutic options, counseling and coordination of care, risks and benefits of management, compliance, or risk factor reduction    PRIOR DISCUSSIONS:   We had a discussion about different medication strategies, possible adverse effects and strategies to manage them, expected time course of benefits in medication management which include hours to days for Mestinon, weeks to many months for prednisone, and many months to a year more for Azathioprine. In mild to moderate generalized myasthenia gravis, Mestinon may be the only treatment. A cholinergic crisis, in which weakness is worsened by increased doses of the Mestinon, rarely occurs. The concomitant use of prednisone and sometimes another immunosuppressant is often required. In more severe myasthenia gravis, immunosuppression and sometimes immunomodulation should be started at the same time as Mestinon.  There is evidence that mycophenolate has a more rapid onset of clinical effect. The medicine comes in 250 mg and 500 mg tablets. The standard dose is 1000 mg twice a day. Some people need a higher dose of 1500 mg twice a day. The primary side effect that you might notice is gastrointestinal upset  Discussed all side effects as per patient instructions and above including risk of  rare brain disease called PML.  The adverse effects of corticosteroids are numerous, well known, and largely dose-dependent. These include: hypertension, fluid retention, weight gain, potassium loss, hyperlipidemia, diabetes mellitus, osteoporosis, gastric ulceration, cataracts, glaucoma, moon facies, obesity, acne, skin friability, juvenile growth suppression, and mood/personality changes.  Discussed again that should he have acute worsening especially  shortness of breath patient should call 911 and be transported to the emergency room due to risks for myasthenia gravis exacerbation and respiratory failure.   Sarina Ill, MD  [facility] [address] [place], [place] [address]  Phone [phone] Fax [phone]

## 2021-02-26 ENCOUNTER — Encounter: Payer: Self-pay | Admitting: *Deleted

## 2021-02-26 ENCOUNTER — Telehealth (INDEPENDENT_AMBULATORY_CARE_PROVIDER_SITE_OTHER): Payer: BC Managed Care – PPO | Admitting: Neurology

## 2021-02-26 ENCOUNTER — Telehealth: Payer: Self-pay | Admitting: Neurology

## 2021-02-26 DIAGNOSIS — G7000 Myasthenia gravis without (acute) exacerbation: Secondary | ICD-10-CM | POA: Diagnosis not present

## 2021-02-26 MED ORDER — AZATHIOPRINE 50 MG PO TABS: 100.0000 mg | ORAL_TABLET | Freq: Two times a day (BID) | ORAL | 3 refills | Status: DC

## 2021-02-26 NOTE — Telephone Encounter (Signed)
Can you call and give him an appointment in 3 months please? He can have any time he wants, he likes 4pm so you can also add a 4pm if he needs a late appointment thanks.

## 2021-04-06 ENCOUNTER — Other Ambulatory Visit: Payer: Self-pay | Admitting: Neurology

## 2021-04-06 DIAGNOSIS — L819 Disorder of pigmentation, unspecified: Secondary | ICD-10-CM

## 2021-04-07 NOTE — Telephone Encounter (Signed)
Sent referral via fax to [facility] for Dr. Renda Rolls. Phone: [phone]. Fax: [phone].

## 2021-05-25 ENCOUNTER — Ambulatory Visit: Payer: BC Managed Care – PPO | Admitting: Neurology

## 2021-05-25 ENCOUNTER — Encounter: Payer: Self-pay | Admitting: Neurology

## 2021-05-25 VITALS — BP 125/79 | HR 60 | Ht 68.5 in | Wt 147.0 lb

## 2021-05-25 DIAGNOSIS — Z79899 Other long term (current) drug therapy: Secondary | ICD-10-CM | POA: Diagnosis not present

## 2021-05-25 DIAGNOSIS — G7000 Myasthenia gravis without (acute) exacerbation: Secondary | ICD-10-CM

## 2021-05-25 NOTE — Progress Notes (Signed)
[facility]    Provider:  Dr Jaynee Eagles Referring Provider: No ref. provider found Primary Care Physician:  Elby Beck, FNP  CC:  Myasthenia gravis  Virtual Visit via Telephone Note  I connected with Eddie [name] on 6/27/2022at  4:15 PM EDT by telephone and verified that I am speaking with the correct person using two identifiers.  Location: Patient: Home Provider: office   I discussed the limitations, risks, security and privacy concerns of performing an evaluation and management service by telephone and the availability of in person appointments. I also discussed with the patient that there may be a patient responsible charge related to this service. The patient expressed understanding and agreed to proceed.     Follow Up Instructions:    I discussed the assessment and treatment plan with the patient. The patient was provided an opportunity to ask questions and all were answered. The patient agreed with the plan and demonstrated an understanding of the instructions.   The patient was advised to call back or seek an in-person evaluation if the symptoms worsen or if the condition fails to improve as anticipated.  I provided  10 minutes of non-face-to-face time during this encounter.   [name], MD  05/25/2021: Patient is doing well on azathioprine.  He is currently on 5 mg of prednisone (in March was on 10 mg).  He will stay on this for approximately a month and then go down to 2.5 mg a day and then alternate 2.5 mg every other day and then stop.  He recently went to dermatology who diagnosed him with tinea versicolor probably due to the long-term steroids.  He has a history of autoimmune diseases in the family.  Question whether he was generalized or purely ocular myasthenia gravis.  When he first came to me he did say he was feeling generally weak and not lifting as many weights as he could prior.  He had ophthalmoplegia and diplopia.     Patient's had thymectomy, steroids, IVIG, [name] (leukopenia), Mestinon, and he is now on azathioprine.  5mg  then down to 2.5mg    Reach out [facility]  Tinea versicolor  Rheumatology - reach out rheumatologist seen or at least any other evaluation for other autoimmunme diseases.    02/26/2021: Doing well on azathioprine. He is taking 100mg  twice dialy. He is on 10mg  of steroids a day, he still has some diplopia (minimal) so we will hold here and discuss decreasing to 7.5mg  at next appointment and get labs too.   12/22/2021He is on 10mg  a day of prednisone. He went 3 weeks before needing steroids again. We discussed the following: Increase imuran Come back in 6 weeks for blood work Stay on 10mg  for 1-2 months then reduce to 7.5mg . goal is get < 7.5 mg or be off completely Follow up 3-4 months please give patient a 4pm appointment   interval history 09/20/2019: 35 year old with Seropositive myasthenia gravis( Ach: Receptor binding antibodies were positive at 0.92). He started [name] 06/2017 100mg  bid and was increased to 1500mg  bid 07/2018. Dose had to be adjusted due to low WBCs. Was on IVIG prior to thymectomy. 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 The thymectomy can take up to a year to show results, will keep on current dose of cellcept and taper steroids.  Examined patient 04/27/2019 and emailed with patient over the weekend.  Patient with worsening ptosis, ophthalmoplegia, significant diplopia and impairment of vision since decreasing [name] due to low white blood cells, cannot start  another immunosuppressant due to his neutropenia,, I have advised patient not to drive, I have increased his steroids unfortunately steroids can take 6 to 12 weeks to work and we cannot keep them for longer than 2 to 3 weeks because we hope you will have his thymus removed and steroids impair healing, need IVIG for myasthenia gravis crisis.  Examined patient's EOM this morning when he came for labs 04/04/2019,  impaired right eye adduction. Took a trough level of cellcept today if he is a fast metabolizer and level not therapeutic may consider increasing doseage of cellcept. 04/04/2019.   Interval history 03/29/2019:  35 year old with Seropositive myasthenia gravis( Ach: Receptor binding antibodies were positive at 0.92). He started [name] 06/2017 100mg  bid and was increased to 1500mg  bid 07/2018. He is on 5mg  of prednisone daily. He feels well except he reports his right eye still does no have full EOM and he cannot fully abduct. He feels he was much better when he was on higher doses of steroids. The cellcept has helped tremendously with his diplopia and ptosis and any generalized symptoms but still reports decreased EOM of the right eye which is bothering him. He does want to get off of the steroids, at this time will decrease to 5mg  every other day but if symptoms worsen will return to 5mg  daily.   Interval history 08/03/2018: He has not been here for over a year and not returned our calls. He has been well. He has experienced more opthalmoplegia when off of the prednisone but at 40mg  with the cellcept he does well. Will increase cellcept. Discussed we really need to decrease his steroids long-term at this dose (or any dose) can cause significant sequelae. Discussed he should be on calcium and vitamin D. Also needs repeat CT chest and eval to cardiothoracic for removal of thymic tissue. Needs labs today and again in 3-4 months. And he needs to be more compliant with his appointments, he has not been here in over a year and has not returned multiple of our calls.  Interval history 05/18/2017: Seropositive myasthenia gravis. Reviewed MRI of the brain with patient, reviewed images which were unremarkable. CT of the chest did not show thymoma or thymus tumor but discussed that there is evidence that removing any thymic tissue improves outcomes in myasthenia gravis. Patient would like to be seen by cardiothoracic surgeon.  Provided literature in myasthenia gravis and thymectomy. Patient started on 40 mg of steroids. He feels improved, his strength is better, getting more repetitions in the gym, the ptosis is minimally better. The mestinon helps with the diplopia. He only takes a 1/2 pill didn't like a whole pill of mestinon. But he feels improved. Had a long discussion about seropositive myasthenia gravis,    HPI:  Eddie [name] is a 35 y.o. male here as a referral from Dr. Carlean Purl for myasthenia gravis. Patient reported diplopia and vision changes starting in February. 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 He was instructed to have thyroid tests as well as myasthenia gravis panel checked. 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 Ach: Receptor binding antibodies were positive at 0.92. Back in February he went on a ski trip and towards the end his eyes started feeling weak with trouble focusing. Father had a thyroid issue so he had testing and tsh was normal. He woke up one night nd looked at the clock on the cable  box and saw 2 different times. Dr. Manuella Ghazi diagnosed him with thyrotoxicosis and then he was told he had myasthenia gravis. His right eye wanders now. He is having difficulty lifting his arms and working out, he can;t weight lift as well as he used to. No issues swallowing but he has loss of strength. The double vision is worse in the morning and bad all day. Not better after a nap is worse. His eyes are misaligned. He has ptosis. No swallowing difficulty. No voice qualityor breathing difficulties. Feels like it is progressing. Strong family history of autoimmune disease.   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 Symptoms have gotten worse since then. He went to see an ophthalmologist who diagnosed him with thyrotoxicosis and diffuse goiter, ocular muscle weakness. He was to hold have thyroid tests and myasthenia gravis panel checked. Labs showed thyrotropin receptor autoantibodies, thyroid stimulating immunoglobulin and he was referred to endocrinology. Also reviewed endocrinology diagnosed with Graves' disease.   Ach Receptor binding antibodies were positive.    Review of Systems: Patient complains of symptoms per HPI as well as the following symptoms: tinea versicolor . Pertinent negatives and positives per HPI. All others negative      Social History   Socioeconomic History   Marital status: Single    Spouse name: Not on file   Number of children: 2   Years of education: 16   Highest education level: Not on file  Occupational History   Occupation: [employer]  Tobacco Use   Smoking status: Never   Smokeless tobacco: Never  Vaping Use   Vaping Use: Never used  Substance and Sexual Activity   Alcohol use: Yes    Alcohol/week: 2.0 standard drinks    Types: 2 Shots of liquor per week   Drug use: No   Sexual activity: Yes    Birth control/protection: Condom  Other Topics Concern   Not on file  Social History Narrative   Lives at home with daughter    Caffeine: coffee 1 cup daily Monday-Friday (none in last month as of 05/25/2021), occasional soda    Right handed   Social Determinants of Health   Financial Resource Strain: Not on file  Food Insecurity: Not on file  Transportation Needs: Not on file  Physical Activity: Not on file  Stress: Not on file  Social Connections: Not on file  Intimate Partner Violence: Not on file    Family History  Problem Relation Age of Onset   Cancer Mother    Thyroid disease Father    Thyroid disease Brother     Past Medical History:  Diagnosis Date   Myasthenia gravis ([place])     Past Surgical  History:  Procedure Laterality Date   eyelid surgery Right    as a teenager   STERNOTOMY N/A 06/12/2019   Procedure: PARTIAL STERNOTOMY;  Surgeon: Ivin Poot, MD;  Location: [facility];  Service: Thoracic;  Laterality: N/A;   THYMECTOMY N/A 06/12/2019   Procedure: THYMECTOMY;  Surgeon: Prescott Gum, Collier Salina, MD;  Location: [facility];  Service: Thoracic;  Laterality: N/A;    Current Outpatient Medications  Medication Sig Dispense Refill   azaTHIOprine (IMURAN) 50 MG tablet Take 2 tablets (100 mg total) by mouth 2 (two) times daily. 360 tablet 3   Nutritional  Supplements (PROTEIN SUPPLEMENT 80% PO) Take 1 Scoop by mouth 2 (two) times a day. MIXED WITH MILK     predniSONE (DELTASONE) 5 MG tablet Take 2 tablets (10 mg total) by mouth daily with breakfast. (Patient taking differently: Take 5 mg by mouth daily with breakfast.) 180 tablet 3   timolol (BETIMOL) 0.25 % ophthalmic solution Place 1 drop into both eyes 2 (two) times daily.      No current facility-administered medications for this visit.    Allergies as of 05/25/2021   (No Known Allergies)    Vitals: BP 125/79 (BP Location: Right Arm, Patient Position: Sitting)   Pulse 60   Ht 5' 8.5" (1.74 m)   Wt 147 lb (66.7 kg)   BMI 22.03 kg/m  Last Weight:  Wt Readings from Last 1 Encounters:  05/25/21 147 lb (66.7 kg)   Last Height:   Ht Readings from Last 1 Encounters:  05/25/21 5' 8.5" (1.74 m)    Exam: NAD, pleasant                  Speech:    Speech is normal; fluent and spontaneous with normal comprehension.  Cognition:    The patient is oriented to person, place, and time;     recent and remote memory intact;     language fluent;    Cranial Nerves:    The pupils are equal, round, and reactive to light.Trigeminal sensation is intact and the muscles of mastication are normal. The face is symmetric. The palate elevates in the midline. Hearing intact. Voice is normal. Shoulder shrug is normal. The tongue has normal motion without  fasciculations.   Coordination:  No dysmetria  Motor Observation:    No asymmetry, no atrophy, and no involuntary movements noted. Tone:    Normal muscle tone.     Strength:    Strength is V/V in the upper and lower limbs.      Sensation: intact to LT       Assessment/Plan:  35 year old with seropositive myasthenia gravis, acetylcholine receptor binding antibody positive  At initial exam he had ophthalmoplegia and fatigable extraocular movements with ptosis and prox muscle weakness(could not lift weights as prior).    - Patient's had thymectomy, steroids, IVIG, [name] (leukopenia), Mestinon, and he is now on azathioprine. - was off of steroids for 3 wekks but had symptoms restarted at 10mg . Now on 5mg  will titrate slowly. 5mg  one month, 2.5mg  one month, then 2.5mg  qod 1 month - Given patient's initial reports of weakness in the gym(resolved) I do suspect there was generalization of myasthenia gravis. Had a long discussion about myasthenia gravis, provided patient with documentation to read as well as reliable online sites for information.  Discussed long-term use of steroids and the risks.s/p thymectomy. - Doing well on azathioprine. He is taking 100mg  twice daily.  -  He recently went to dermatology who diagnosed him with tinea versicolor probably due to the long-term steroids.   - He has a history of autoimmune diseases in the family.  - HgbA1c 6.1, if he stays on prednisone much longer may consider metformin, would reach out [facility] - Rheumatology - reach out rheumatologist seen or at least any other evaluation for other autoimmunme diseases.   Orders Placed This Encounter  Procedures   CBC with Differential/Platelets   Comprehensive metabolic panel   Amylase   Lipase   Thiopurine Metabolites   Hemoglobin A1c   Thiopurine methyltransferase(tpmt)rbc    I spent 40  minutes of  face-to-face and non-face-to-face time with patient on the  1. Long-term use of high-risk  medication    diagnosis.  This included previsit chart review, lab review, study review, order entry, electronic health record documentation, patient education on the different diagnostic and therapeutic options, counseling and coordination of care, risks and benefits of management, compliance, or risk factor reduction     PRIOR DISCUSSIONS:   We had a discussion about different medication strategies, possible adverse effects and strategies to manage them, expected time course of benefits in medication management which include hours to days for Mestinon, weeks to many months for prednisone, and many months to a year more for Azathioprine. In mild to moderate generalized myasthenia gravis, Mestinon may be the only treatment. A cholinergic crisis, in which weakness is worsened by increased doses of the Mestinon, rarely occurs. The concomitant use of prednisone and sometimes another immunosuppressant is often required. In more severe myasthenia gravis, immunosuppression and sometimes immunomodulation should be started at the same time as Mestinon.   There is evidence that mycophenolate has a more rapid onset of clinical effect. The medicine comes in 250 mg and 500 mg tablets. The standard dose is 1000 mg twice a day. Some people need a higher dose of 1500 mg twice a day. The primary side effect that you might notice is gastrointestinal upset  Discussed all side effects as per patient instructions and above including risk of rare brain disease called PML.   The adverse effects of corticosteroids are numerous, well known, and largely dose-dependent. These include: hypertension, fluid retention, weight gain, potassium loss, hyperlipidemia, diabetes mellitus, osteoporosis, gastric ulceration, cataracts, glaucoma, moon facies, obesity, acne, skin friability, juvenile growth suppression, and mood/personality changes.   Discussed again that should he have acute worsening especially shortness of breath patient  should call 911 and be transported to the emergency room due to risks for myasthenia gravis exacerbation and respiratory failure.   Sarina Ill, MD  [facility] [address] [place], [place] [address]  Phone [phone] Fax [phone]  I spent 30 minutes of face-to-face and non-face-to-face time with patient on the  1. Myasthenia gravis ([place])   2. Long-term use of high-risk medication    diagnosis.  This included previsit chart review, lab review, study review, order entry, electronic health record documentation, patient education on the different diagnostic and therapeutic options, counseling and coordination of care, risks and benefits of management, compliance, or risk factor reduction

## 2021-05-27 DIAGNOSIS — G7000 Myasthenia gravis without (acute) exacerbation: Secondary | ICD-10-CM | POA: Insufficient documentation

## 2021-05-28 ENCOUNTER — Telehealth: Payer: Self-pay | Admitting: *Deleted

## 2021-05-28 ENCOUNTER — Other Ambulatory Visit: Payer: Self-pay | Admitting: Neurology

## 2021-05-28 DIAGNOSIS — Z79899 Other long term (current) drug therapy: Secondary | ICD-10-CM

## 2021-05-28 NOTE — Progress Notes (Signed)
His white blood cells are just a tad on the lower side, nothing concerning right now but we will need to repeat them in about a month so have him to come to the office at his convenience to recheck in about 4-6 weeks. His HgbA1c is 6.1, stable, but still in the prediabetes range due to the steroids. If we cannot get him off of steroids in the next 3 months we should start metformin which is a diabetes medication but I do think we can get him off of the steroids so let's hold. thanks

## 2021-05-28 NOTE — Telephone Encounter (Signed)
-----   Message from [name], MD sent at 05/28/2021  9:17 AM EDT ----- His white blood cells are just a tad on the lower side, nothing concerning right now but we will need to repeat them in about a month so have him to come to the office at his convenience to recheck in about 4-6 weeks. His HgbA1c is 6.1, stable, but still in the prediabetes range due to the steroids. If we cannot get him off of steroids in the next 3 months we should start metformin which is a diabetes medication but I do think we can get him off of the steroids so let's hold. thanks

## 2021-05-28 NOTE — Telephone Encounter (Signed)
Spoke with patient and discussed lab results.  He verbalized understanding of Dr Cathren Laine entire message as noted below.  He understands to come back at his convenience in 4 to 6 weeks for recheck of labs. He verbalized appreciation.

## 2021-05-31 LAB — SERIAL MONITORING

## 2021-06-02 LAB — CBC WITH DIFFERENTIAL/PLATELET
Basophils Absolute: 0 10*3/uL (ref 0.0–0.2)
Basos: 1 %
EOS (ABSOLUTE): 0 10*3/uL (ref 0.0–0.4)
Eos: 1 %
Hematocrit: 43.8 % (ref 37.5–51.0)
Hemoglobin: 14.5 g/dL (ref 13.0–17.7)
Immature Grans (Abs): 0 10*3/uL (ref 0.0–0.1)
Immature Granulocytes: 0 %
Lymphocytes Absolute: 0.5 10*3/uL — ABNORMAL LOW (ref 0.7–3.1)
Lymphs: 21 %
MCH: 30 pg (ref 26.6–33.0)
MCHC: 33.1 g/dL (ref 31.5–35.7)
MCV: 91 fL (ref 79–97)
Monocytes Absolute: 0.3 10*3/uL (ref 0.1–0.9)
Monocytes: 10 %
Neutrophils Absolute: 1.7 10*3/uL (ref 1.4–7.0)
Neutrophils: 67 %
Platelets: 156 10*3/uL (ref 150–450)
RBC: 4.84 x10E6/uL (ref 4.14–5.80)
RDW: 13.3 % (ref 11.6–15.4)
WBC: 2.6 10*3/uL — ABNORMAL LOW (ref 3.4–10.8)

## 2021-06-02 LAB — THIOPURINE METABOLITES
6-MMPN Metaboilte: 5534 pmol/8x 10E8
6-TGN Metabolite: 338 pmol/8x 10E8

## 2021-06-02 LAB — COMPREHENSIVE METABOLIC PANEL
ALT: 37 IU/L (ref 0–44)
AST: 31 IU/L (ref 0–40)
Albumin/Globulin Ratio: 1.8 (ref 1.2–2.2)
Albumin: 4.4 g/dL (ref 4.0–5.0)
Alkaline Phosphatase: 49 IU/L (ref 44–121)
BUN/Creatinine Ratio: 19 (ref 9–20)
BUN: 19 mg/dL (ref 6–20)
Bilirubin Total: 0.6 mg/dL (ref 0.0–1.2)
CO2: 24 mmol/L (ref 20–29)
Calcium: 9.6 mg/dL (ref 8.7–10.2)
Chloride: 102 mmol/L (ref 96–106)
Creatinine, Ser: 1.02 mg/dL (ref 0.76–1.27)
Globulin, Total: 2.5 g/dL (ref 1.5–4.5)
Glucose: 150 mg/dL — ABNORMAL HIGH (ref 65–99)
Potassium: 4.3 mmol/L (ref 3.5–5.2)
Sodium: 139 mmol/L (ref 134–144)
Total Protein: 6.9 g/dL (ref 6.0–8.5)
eGFR: 98 mL/min/{1.73_m2} (ref >59)

## 2021-06-02 LAB — AMYLASE: Amylase: 101 U/L (ref 31–110)

## 2021-06-02 LAB — HEMOGLOBIN A1C
Est. average glucose Bld gHb Est-mCnc: 128 mg/dL
Hgb A1c MFr Bld: 6.1 % — ABNORMAL HIGH (ref 4.8–5.6)

## 2021-06-02 LAB — LIPASE: Lipase: 26 U/L (ref 13–78)

## 2021-06-20 ENCOUNTER — Other Ambulatory Visit: Payer: Self-pay | Admitting: Neurology

## 2021-06-20 MED ORDER — PYRIDOSTIGMINE BROMIDE 60 MG PO TABS: ORAL_TABLET | ORAL | 6 refills | Status: DC

## 2021-06-21 ENCOUNTER — Other Ambulatory Visit: Payer: Self-pay | Admitting: Neurology

## 2021-06-21 DIAGNOSIS — G7000 Myasthenia gravis without (acute) exacerbation: Secondary | ICD-10-CM

## 2021-06-21 DIAGNOSIS — Z79899 Other long term (current) drug therapy: Secondary | ICD-10-CM

## 2021-06-24 ENCOUNTER — Telehealth: Payer: Self-pay

## 2021-06-24 NOTE — Telephone Encounter (Signed)
Referral sent to [facility]. P: [id number].

## 2021-08-27 ENCOUNTER — Telehealth: Payer: BC Managed Care – PPO | Admitting: Neurology

## 2021-08-31 ENCOUNTER — Encounter: Payer: Self-pay | Admitting: Neurology

## 2022-01-20 ENCOUNTER — Encounter: Payer: Self-pay | Admitting: Neurology

## 2022-01-20 DIAGNOSIS — Z79899 Other long term (current) drug therapy: Secondary | ICD-10-CM

## 2022-01-20 DIAGNOSIS — G7000 Myasthenia gravis without (acute) exacerbation: Secondary | ICD-10-CM

## 2022-01-20 MED ORDER — PREDNISONE 5 MG PO TABS: 10.0000 mg | ORAL_TABLET | Freq: Every day | ORAL | 3 refills | Status: DC

## 2022-12-08 ENCOUNTER — Ambulatory Visit: Payer: BC Managed Care – PPO | Admitting: Nurse Practitioner

## 2022-12-08 ENCOUNTER — Encounter: Payer: Self-pay | Admitting: Nurse Practitioner

## 2022-12-08 VITALS — BP 118/72 | HR 65 | Ht 68.5 in | Wt 149.0 lb

## 2022-12-08 DIAGNOSIS — G7000 Myasthenia gravis without (acute) exacerbation: Secondary | ICD-10-CM | POA: Diagnosis not present

## 2022-12-08 DIAGNOSIS — R7303 Prediabetes: Secondary | ICD-10-CM

## 2022-12-08 DIAGNOSIS — Z79899 Other long term (current) drug therapy: Secondary | ICD-10-CM

## 2022-12-08 DIAGNOSIS — H6121 Impacted cerumen, right ear: Secondary | ICD-10-CM | POA: Insufficient documentation

## 2022-12-08 DIAGNOSIS — Z0000 Encounter for general adult medical examination without abnormal findings: Secondary | ICD-10-CM | POA: Insufficient documentation

## 2022-12-08 NOTE — Assessment & Plan Note (Signed)
Patient currently followed by neurology Dr. Jaynee Eagles.  Patient currently on 10 mg of prednisone has been on prednisone for extended period of time encouraged follow-up with neurology take medication as prescribed.

## 2022-12-08 NOTE — Assessment & Plan Note (Addendum)
Verbal consent obtained.  Patient was prepped per office policy mixture of water hydroperoxide was used and ear irrigated.  Patient tolerated procedure well.  Impaction dislodged.

## 2022-12-08 NOTE — Assessment & Plan Note (Signed)
Patient is active and nonobese.  Likely secondary to steroid use with recent increase in time.

## 2022-12-08 NOTE — Patient Instructions (Signed)
Nice to see you today  I will be in touch with your labs once I have the results I will want to see you in 1 year for your next physical, sooner if you need me Call Dr Cathren Laine office for a follow up

## 2022-12-08 NOTE — Assessment & Plan Note (Addendum)
Discussed age-appropriate immunizations and screening exams.  Patient was given information at discharge about preventative healthcare maintenance for his age range.  Patient refused flu vaccine up-to-date on COVID and tetanus.

## 2022-12-08 NOTE — Progress Notes (Signed)
Established Patient Office Visit  Subjective   Patient ID: Eddie [name], male    DOB: Jul 09, 1986  Age: 37 y.o. MRN: [id number]  Chief Complaint  Patient presents with   Establish Care    HPI  Myasthenia gravis: States he is following with Neurlogy Dr. Jaynee Eagles. Has been weaning down on prednisone. States that he is back on '10mg'$ .  Patient states he has been prediabetic he thinks in the past.  for complete physical and follow up of chronic conditions.  Immunizations: -Tetanus: Completed in 2018 -Influenza: Refused -Shingles: Too young -Pneumonia: Too young Covid: original 2  -HPV:  Diet: Fair diet. States that he will have 3 meals a day. Some snacking. Coffee, water, red bull daily ( most days) 3 days a week Exercise: 3-5 days a week 1.5 hours a time with weight and cardio  Eye exam:  maybe a year ago and wears glasses Dental exam: Completes semi-annually     Colonoscopy:  States that his dad may have had colon cancer. 86 Lung Cancer Screening: N/A Dexa: N/A  PSA:  too young  Sleep: Goes to bed around 10-11 and gets up around 6. Feels rested. Does not snore  No Sti concern        Review of Systems  Constitutional:  Negative for chills and fever.  Respiratory:  Negative for shortness of breath.   Cardiovascular:  Negative for chest pain and leg swelling.  Gastrointestinal:  Negative for abdominal pain, blood in stool, constipation, diarrhea, nausea and vomiting.       Bm eery other day   Genitourinary:  Negative for dysuria and hematuria.  Neurological:  Negative for tingling and headaches.  Psychiatric/Behavioral:  Negative for hallucinations and suicidal ideas.       Objective:     BP 118/72   Pulse 65   Ht 5' 8.5" (1.74 m)   Wt 149 lb (67.6 kg)   SpO2 98%   BMI 22.33 kg/m    Physical Exam Vitals and nursing note reviewed. Exam conducted with a chaperone present Adela Glimpse, CMA).  Constitutional:      Appearance: Normal appearance.   HENT:     Right Ear: Ear canal and external ear normal. There is impacted cerumen.     Left Ear: Tympanic membrane, ear canal and external ear normal.     Mouth/Throat:     Mouth: Mucous membranes are moist.     Pharynx: Oropharynx is clear.  Eyes:     Extraocular Movements: Extraocular movements intact.     Pupils: Pupils are equal, round, and reactive to light.  Cardiovascular:     Rate and Rhythm: Normal rate and regular rhythm.     Pulses: Normal pulses.     Heart sounds: Normal heart sounds.  Pulmonary:     Effort: Pulmonary effort is normal.     Breath sounds: Normal breath sounds.  Abdominal:     General: Bowel sounds are normal. There is no distension.     Palpations: There is no mass.     Tenderness: There is no abdominal tenderness.     Hernia: No hernia is present. There is no hernia in the left inguinal area or right inguinal area.  Genitourinary:    Penis: Normal.      Testes: Normal.     Epididymis:     Right: Normal.     Left: Normal.  Musculoskeletal:     Right lower leg: No edema.     Left lower leg:  No edema.  Lymphadenopathy:     Cervical: No cervical adenopathy.     Lower Body: No right inguinal adenopathy. No left inguinal adenopathy.  Skin:    General: Skin is warm.  Neurological:     General: No focal deficit present.     Mental Status: He is alert.     Deep Tendon Reflexes:     Reflex Scores:      Bicep reflexes are 2+ on the right side and 2+ on the left side.      Patellar reflexes are 2+ on the right side and 2+ on the left side.    Comments: Bilateral upper and lower extremity strength 5/5  Psychiatric:        Mood and Affect: Mood normal.        Behavior: Behavior normal.        Thought Content: Thought content normal.        Judgment: Judgment normal.      No results found for any visits on 12/08/22.    The ASCVD Risk score (Arnett DK, et al., 2019) failed to calculate for the following reasons:   The 2019 ASCVD risk score is  only valid for ages 5 to 35    Assessment & Plan:   Problem List Items Addressed This Visit       Nervous and Auditory   Ocular myasthenia gravis [facility])    Patient currently followed by neurology Dr. Jaynee Eagles.  Patient currently on 10 mg of prednisone has been on prednisone for extended period of time encouraged follow-up with neurology take medication as prescribed.      Myasthenia gravis [facility])    Patient currently followed by neurology Dr. Jaynee Eagles.  Patient currently on 10 mg of prednisone has been on prednisone for extended period of time encouraged follow-up with neurology take medication as prescribed.      Impacted cerumen of right ear    Verbal consent obtained.  Patient was prepped per office policy mixture of water hydroperoxide was used and ear irrigated.  Patient tolerated procedure well.  Impaction dislodged.      Relevant Orders   Ear Lavage     Other   Long-term use of high-risk medication   Relevant Orders   Hemoglobin A1c   Preventative health care - Primary    Discussed age-appropriate immunizations and screening exams.  Patient was given information at discharge about preventative healthcare maintenance for his age range.  Patient refused flu vaccine up-to-date on COVID and tetanus.      Relevant Orders   Lipid panel   TSH   Comprehensive metabolic panel   CBC   Prediabetes    Patient is active and nonobese.  Likely secondary to steroid use with recent increase in time.       Return in about 1 year (around 12/09/2023) for CPE and Labs.    Romilda Garret, NP

## 2022-12-09 LAB — COMPREHENSIVE METABOLIC PANEL
ALT: 27 U/L (ref 0–53)
AST: 24 U/L (ref 0–37)
Albumin: 4.9 g/dL (ref 3.5–5.2)
Alkaline Phosphatase: 51 U/L (ref 39–117)
BUN: 15 mg/dL (ref 6–23)
CO2: 29 mEq/L (ref 19–32)
Calcium: 10.1 mg/dL (ref 8.4–10.5)
Chloride: 104 mEq/L (ref 96–112)
Creatinine, Ser: 1.15 mg/dL (ref 0.40–1.50)
GFR: 80.85 mL/min (ref >60.00)
Glucose, Bld: 104 mg/dL — ABNORMAL HIGH (ref 70–99)
Potassium: 4.2 mEq/L (ref 3.5–5.1)
Sodium: 143 mEq/L (ref 135–145)
Total Bilirubin: 0.7 mg/dL (ref 0.2–1.2)
Total Protein: 7.8 g/dL (ref 6.0–8.3)

## 2022-12-09 LAB — CBC
HCT: 44.2 % (ref 39.0–52.0)
Hemoglobin: 14.8 g/dL (ref 13.0–17.0)
MCHC: 33.5 g/dL (ref 30.0–36.0)
MCV: 90.1 fl (ref 78.0–100.0)
Platelets: 200 10*3/uL (ref 150.0–400.0)
RBC: 4.91 Mil/uL (ref 4.22–5.81)
RDW: 13.7 % (ref 11.5–15.5)
WBC: 4.5 10*3/uL (ref 4.0–10.5)

## 2022-12-09 LAB — LIPID PANEL
Cholesterol: 182 mg/dL (ref 0–200)
HDL: 73.9 mg/dL (ref >39.00)
LDL Cholesterol: 96 mg/dL (ref 0–99)
NonHDL: 107.61
Total CHOL/HDL Ratio: 2
Triglycerides: 58 mg/dL (ref 0.0–149.0)
VLDL: 11.6 mg/dL (ref 0.0–40.0)

## 2022-12-09 LAB — TSH: TSH: 0.4 u[IU]/mL (ref 0.35–5.50)

## 2022-12-09 LAB — HEMOGLOBIN A1C: Hgb A1c MFr Bld: 6.1 % (ref 4.6–6.5)

## 2022-12-29 ENCOUNTER — Telehealth: Payer: Self-pay | Admitting: Neurology

## 2022-12-29 DIAGNOSIS — Z79899 Other long term (current) drug therapy: Secondary | ICD-10-CM

## 2022-12-29 DIAGNOSIS — G7000 Myasthenia gravis without (acute) exacerbation: Secondary | ICD-10-CM

## 2022-12-29 MED ORDER — PREDNISONE 5 MG PO TABS: 10.0000 mg | ORAL_TABLET | Freq: Every day | ORAL | 0 refills | Status: DC

## 2022-12-29 NOTE — Telephone Encounter (Signed)
Pt is needing a refill on his predniSONE (DELTASONE) 5 MG tablet sent to the [facility] in [place].

## 2022-12-29 NOTE — Telephone Encounter (Signed)
We can refill his '10mg'$  of prednisone for 6 months but he really needs to follow up with Dr. Mikle Bosworth who wanted him to follow up. At this point he needs [facility] and Dr. Mikle Bosworth who is a specialist in myasthenia gravis. Tell him we will refill his prednisone but we want him to transition his care to Dr. Mikle Bosworth because we have tried multiple things that have not worked, he needs an academic center and he needs regular follow up with a [facility] specialist at this time. Have him call and get an apt with Dr. Mikle Bosworth and transition his care there. thanks

## 2022-12-29 NOTE — Addendum Note (Signed)
Addended by: Gildardo Griffes on: 12/29/2022 03:30 PM   Modules accepted: Orders

## 2022-12-30 MED ORDER — PREDNISONE 5 MG PO TABS: 10.0000 mg | ORAL_TABLET | Freq: Every day | ORAL | 1 refills | Status: AC

## 2022-12-30 NOTE — Telephone Encounter (Signed)
I called and relayed that his prescription for prednisone renewed and refilled for 6 months.  Relayed that per Dr. Jaynee Eagles she wants him to [facility] to [facility] Dr. Mikle Bosworth [facility] specialist (whom he has seen previously).  I gave him the # and address.  He will call and get appointment.  He verbalized understanding of plan.  Appreciated call back.

## 2022-12-30 NOTE — Telephone Encounter (Signed)
Will call him today. I gave him 6 months of prednisone.

## 2022-12-30 NOTE — Addendum Note (Signed)
Addended by: Gildardo Griffes on: 12/30/2022 08:27 AM   Modules accepted: Orders

## 2023-01-05 NOTE — Telephone Encounter (Signed)
Spoke with [facility]. They have the prescription on file from 2/1 but cannot fill it yet because patient just filled a 90 day supply on 12/03/22. I called the pt and let him know. I also canceled the 3/6 appt. He verbalized appreciation for the call. He will see the specialist.

## 2023-01-05 NOTE — Telephone Encounter (Signed)
Pt was made aware of the 02-01  entry from [place], [place] he said when he went to the pharmacy he was told they did not see that anything had been called in, pt asking if it can be resent

## 2023-01-13 ENCOUNTER — Encounter: Payer: Self-pay | Admitting: Nurse Practitioner

## 2023-01-13 NOTE — Telephone Encounter (Signed)
Can we schedule patient for an acute visit please

## 2023-01-13 NOTE — Telephone Encounter (Signed)
Scheduled ov for 01/19/23

## 2023-01-14 ENCOUNTER — Ambulatory Visit: Payer: BC Managed Care – PPO | Admitting: Primary Care

## 2023-01-14 ENCOUNTER — Encounter: Payer: Self-pay | Admitting: Primary Care

## 2023-01-14 VITALS — BP 132/84 | HR 65 | Temp 99.1°F | Ht 68.0 in | Wt 150.0 lb

## 2023-01-14 DIAGNOSIS — H0011 Chalazion right upper eyelid: Secondary | ICD-10-CM | POA: Diagnosis not present

## 2023-01-14 NOTE — Assessment & Plan Note (Signed)
Without complication.  Exam today stable.  Referral placed to ophthalmology for evaluation sooner than scheduled in May 2024.

## 2023-01-14 NOTE — Patient Instructions (Signed)
You will either be contacted via phone regarding your referral to ophthalmology, or you may receive a letter on your MyChart portal from our referral team with instructions for scheduling an appointment. Please let us know if you have not been contacted by anyone within two weeks.  It was a pleasure to see you today!

## 2023-01-14 NOTE — Progress Notes (Signed)
Subjective:    Patient ID: Eddie [name], male    DOB: Apr 05, 1986, 37 y.o.   MRN: [id number]  HPI  Eddie [name] is a very pleasant 37 y.o. male patient of Romilda Garret, NP with a history of myasthenia gravis, cerumen impaction, thymectomy, prediabetes who presents today   His eye lid mass is located to the right top eyelid which has been present for the last 2 months. He's not noticed increased size in his mass but the mass has not gone away.  He denies pain, redness, visual disturbances, swelling. He's tried warm compresses without improvement. He does have an appointment with an optometrist in [place] in May 2024.   Prior history of sutures to the right eye from a basketball injury at the age of 6.    Review of Systems  Eyes:  Negative for pain, discharge, redness and visual disturbance.       Chalazion to right upper eyelid          Past Medical History:  Diagnosis Date   Myasthenia gravis ([place])     Social History   Socioeconomic History   Marital status: Single    Spouse name: Not on file   Number of children: 2   Years of education: 16   Highest education level: Master's degree (e.g., MA, MS, MEng, MEd, MSW, MBA)  Occupational History   Occupation: [employer]  Tobacco Use   Smoking status: Never   Smokeless tobacco: Never  Vaping Use   Vaping Use: Never used  Substance and Sexual Activity   Alcohol use: Yes    Alcohol/week: 2.0 standard drinks of alcohol    Types: 2 Shots of liquor per week   Drug use: No   Sexual activity: Yes    Birth control/protection: Condom  Other Topics Concern   Not on file  Social History Narrative   Lives at home with daughter    Caffeine: coffee 1 cup daily Monday-Friday (none in last month as of 05/25/2021), occasional soda    Right handed      Yuvaan (8)   Rhyelle (3)      Fulltime: Pharmacist, 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      Hobbies: hunting    Social Determinants of Health   Financial Resource Strain: Not on file   Food Insecurity: Not on file  Transportation Needs: Not on file  Physical Activity: Not on file  Stress: Not on file  Social Connections: Not on file  Intimate Partner Violence: Not on file    Past Surgical History:  Procedure Laterality Date   eyelid surgery Right    as a teenager   STERNOTOMY N/A 06/12/2019   Procedure: PARTIAL STERNOTOMY;  Surgeon: Ivin Poot, MD;  Location: [facility];  Service: Thoracic;  Laterality: N/A;   THYMECTOMY N/A 06/12/2019   Procedure: THYMECTOMY;  Surgeon: Prescott Gum, Collier Salina, MD;  Location: [facility];  Service: Thoracic;  Laterality: N/A;    Family History  Problem Relation Age of Onset   Cancer Mother    Cancer Father    Thyroid disease Father    Thyroid disease Brother     No Known Allergies  Current Outpatient Medications on File Prior to Visit  Medication Sig Dispense Refill   Nutritional Supplements (PROTEIN SUPPLEMENT 80% PO) Take 1 Scoop by mouth 2 (two) times a day. MIXED WITH MILK     predniSONE (DELTASONE) 5 MG tablet Take 2 tablets (10 mg total) by mouth daily with breakfast. 180 tablet  1   timolol (BETIMOL) 0.25 % ophthalmic solution Place 1 drop into both eyes 2 (two) times daily.      No current facility-administered medications on file prior to visit.    BP 132/84   Pulse 65   Temp 99.1 F (37.3 C) (Temporal)   Ht 5' 8"$  (1.727 m)   Wt 150 lb (68 kg)   SpO2 99%   BMI 22.81 kg/m  Objective:   Physical Exam Constitutional:      General: He is not in acute distress. Eyes:     General:        Right eye: No discharge or hordeolum.     Conjunctiva/sclera:     Right eye: Right conjunctiva is not injected. No exudate.     Comments: Chalazion to right mid upper eyelid   Skin:    General: Skin is warm and dry.           Assessment & Plan:  Chalazion of right upper eyelid Assessment & Plan: Without complication.  Exam today stable.  Referral placed to ophthalmology for evaluation sooner than scheduled in May  2024.  Orders: -     Ambulatory referral to Ophthalmology        Pleas Koch, NP

## 2023-01-19 ENCOUNTER — Ambulatory Visit: Payer: BC Managed Care – PPO | Admitting: Nurse Practitioner

## 2023-02-02 ENCOUNTER — Ambulatory Visit: Payer: BC Managed Care – PPO | Admitting: Neurology

## 2023-04-05 ENCOUNTER — Institutional Professional Consult (permissible substitution): Payer: BC Managed Care – PPO | Admitting: Plastic Surgery

## 2023-04-18 ENCOUNTER — Encounter (INDEPENDENT_AMBULATORY_CARE_PROVIDER_SITE_OTHER): Payer: BC Managed Care – PPO | Admitting: Ophthalmology

## 2023-05-13 ENCOUNTER — Encounter (INDEPENDENT_AMBULATORY_CARE_PROVIDER_SITE_OTHER): Payer: BC Managed Care – PPO | Admitting: Ophthalmology

## 2023-05-13 DIAGNOSIS — H33303 Unspecified retinal break, bilateral: Secondary | ICD-10-CM

## 2023-05-13 DIAGNOSIS — H43813 Vitreous degeneration, bilateral: Secondary | ICD-10-CM

## 2023-05-24 ENCOUNTER — Encounter (INDEPENDENT_AMBULATORY_CARE_PROVIDER_SITE_OTHER): Payer: BC Managed Care – PPO | Admitting: Ophthalmology

## 2023-05-24 DIAGNOSIS — H33303 Unspecified retinal break, bilateral: Secondary | ICD-10-CM

## 2023-06-07 ENCOUNTER — Encounter (INDEPENDENT_AMBULATORY_CARE_PROVIDER_SITE_OTHER): Payer: BC Managed Care – PPO | Admitting: Ophthalmology
